# Patient Record
Sex: Female | Born: 1975 | Race: White | Hispanic: No | Marital: Married | Smoking: Never smoker
Health system: Southern US, Community
[De-identification: ages and names within clinical notes are randomized; demographics above are authoritative.]

## PROBLEM LIST (undated history)

## (undated) DIAGNOSIS — K2970 Gastritis, unspecified, without bleeding: Secondary | ICD-10-CM

## (undated) DIAGNOSIS — E785 Hyperlipidemia, unspecified: Secondary | ICD-10-CM

## (undated) DIAGNOSIS — R000 Tachycardia, unspecified: Secondary | ICD-10-CM

## (undated) HISTORY — PX: VARICOSE VEIN SURGERY: SHX832

## (undated) HISTORY — PX: WISDOM TOOTH EXTRACTION: SHX21

## (undated) HISTORY — DX: Hyperlipidemia, unspecified: E78.5

## (undated) HISTORY — PX: OVARIAN CYST REMOVAL: SHX89

---

## 1998-02-04 ENCOUNTER — Emergency Department (HOSPITAL_COMMUNITY): Admission: EM | Admit: 1998-02-04 | Discharge: 1998-02-04 | Payer: Self-pay

## 1998-03-27 ENCOUNTER — Other Ambulatory Visit: Admission: RE | Admit: 1998-03-27 | Discharge: 1998-03-27 | Payer: Self-pay | Admitting: Obstetrics & Gynecology

## 1999-12-13 ENCOUNTER — Other Ambulatory Visit: Admission: RE | Admit: 1999-12-13 | Discharge: 1999-12-13 | Payer: Self-pay | Admitting: Obstetrics & Gynecology

## 2001-05-16 ENCOUNTER — Other Ambulatory Visit: Admission: RE | Admit: 2001-05-16 | Discharge: 2001-05-16 | Payer: Self-pay | Admitting: Obstetrics & Gynecology

## 2002-07-22 ENCOUNTER — Other Ambulatory Visit: Admission: RE | Admit: 2002-07-22 | Discharge: 2002-07-22 | Payer: Self-pay | Admitting: Obstetrics & Gynecology

## 2002-10-21 ENCOUNTER — Other Ambulatory Visit: Admission: RE | Admit: 2002-10-21 | Discharge: 2002-10-21 | Payer: Self-pay | Admitting: Obstetrics & Gynecology

## 2003-09-22 ENCOUNTER — Other Ambulatory Visit: Admission: RE | Admit: 2003-09-22 | Discharge: 2003-09-22 | Payer: Self-pay | Admitting: Obstetrics and Gynecology

## 2005-09-12 ENCOUNTER — Other Ambulatory Visit: Admission: RE | Admit: 2005-09-12 | Discharge: 2005-09-12 | Payer: Self-pay | Admitting: Obstetrics & Gynecology

## 2006-03-16 ENCOUNTER — Inpatient Hospital Stay (HOSPITAL_COMMUNITY): Admission: AD | Admit: 2006-03-16 | Discharge: 2006-03-19 | Payer: Self-pay | Admitting: Obstetrics & Gynecology

## 2006-03-20 ENCOUNTER — Encounter: Admission: RE | Admit: 2006-03-20 | Discharge: 2006-04-19 | Payer: Self-pay | Admitting: Obstetrics & Gynecology

## 2006-04-20 ENCOUNTER — Encounter: Admission: RE | Admit: 2006-04-20 | Discharge: 2006-05-19 | Payer: Self-pay | Admitting: Obstetrics & Gynecology

## 2006-05-20 ENCOUNTER — Encounter: Admission: RE | Admit: 2006-05-20 | Discharge: 2006-06-19 | Payer: Self-pay | Admitting: Obstetrics & Gynecology

## 2006-06-20 ENCOUNTER — Encounter: Admission: RE | Admit: 2006-06-20 | Discharge: 2006-07-19 | Payer: Self-pay | Admitting: Obstetrics & Gynecology

## 2006-07-20 ENCOUNTER — Encounter: Admission: RE | Admit: 2006-07-20 | Discharge: 2006-08-19 | Payer: Self-pay | Admitting: Obstetrics & Gynecology

## 2006-08-20 ENCOUNTER — Encounter: Admission: RE | Admit: 2006-08-20 | Discharge: 2006-09-19 | Payer: Self-pay | Admitting: Obstetrics & Gynecology

## 2006-09-20 ENCOUNTER — Encounter: Admission: RE | Admit: 2006-09-20 | Discharge: 2006-10-03 | Payer: Self-pay | Admitting: Obstetrics & Gynecology

## 2006-11-06 ENCOUNTER — Ambulatory Visit: Payer: Self-pay | Admitting: Vascular Surgery

## 2007-02-12 ENCOUNTER — Ambulatory Visit: Payer: Self-pay | Admitting: Vascular Surgery

## 2007-03-02 ENCOUNTER — Emergency Department (HOSPITAL_COMMUNITY): Admission: EM | Admit: 2007-03-02 | Discharge: 2007-03-03 | Payer: Self-pay | Admitting: Emergency Medicine

## 2007-03-21 ENCOUNTER — Ambulatory Visit: Payer: Self-pay | Admitting: Vascular Surgery

## 2007-03-28 ENCOUNTER — Ambulatory Visit: Payer: Self-pay | Admitting: Vascular Surgery

## 2008-01-28 ENCOUNTER — Ambulatory Visit: Payer: Self-pay | Admitting: Family Medicine

## 2008-01-28 DIAGNOSIS — E785 Hyperlipidemia, unspecified: Secondary | ICD-10-CM

## 2008-01-28 DIAGNOSIS — E663 Overweight: Secondary | ICD-10-CM | POA: Insufficient documentation

## 2008-01-28 DIAGNOSIS — R030 Elevated blood-pressure reading, without diagnosis of hypertension: Secondary | ICD-10-CM

## 2008-01-28 LAB — CONVERTED CEMR LAB
ALT: 14 units/L (ref 0–35)
AST: 19 units/L (ref 0–37)
Albumin: 3.8 g/dL (ref 3.5–5.2)
BUN: 10 mg/dL (ref 6–23)
Basophils Relative: 0.5 % (ref 0.0–1.0)
CO2: 27 meq/L (ref 19–32)
Chloride: 105 meq/L (ref 96–112)
Creatinine, Ser: 0.7 mg/dL (ref 0.4–1.2)
Direct LDL: 167 mg/dL
Eosinophils Absolute: 0.1 10*3/uL (ref 0.0–0.7)
Eosinophils Relative: 1 % (ref 0.0–5.0)
HDL: 39.5 mg/dL (ref >39.0)
Lymphocytes Relative: 23.6 % (ref 12.0–46.0)
MCV: 85.6 fL (ref 78.0–100.0)
Neutrophils Relative %: 69.3 % (ref 43.0–77.0)
RBC: 4.66 M/uL (ref 3.87–5.11)
Total Protein: 7.2 g/dL (ref 6.0–8.3)
WBC: 8.3 10*3/uL (ref 4.5–10.5)

## 2008-02-01 ENCOUNTER — Telehealth: Payer: Self-pay | Admitting: Family Medicine

## 2008-02-07 ENCOUNTER — Encounter: Admission: RE | Admit: 2008-02-07 | Discharge: 2008-02-07 | Payer: Self-pay | Admitting: Family Medicine

## 2008-02-18 ENCOUNTER — Ambulatory Visit: Payer: Self-pay | Admitting: Family Medicine

## 2008-02-27 ENCOUNTER — Telehealth (INDEPENDENT_AMBULATORY_CARE_PROVIDER_SITE_OTHER): Payer: Self-pay | Admitting: *Deleted

## 2008-05-21 ENCOUNTER — Ambulatory Visit: Payer: Self-pay | Admitting: Family Medicine

## 2008-05-21 LAB — CONVERTED CEMR LAB: Direct LDL: 154.6 mg/dL

## 2008-05-28 ENCOUNTER — Ambulatory Visit: Payer: Self-pay | Admitting: Family Medicine

## 2010-02-05 ENCOUNTER — Emergency Department (HOSPITAL_COMMUNITY): Admission: EM | Admit: 2010-02-05 | Discharge: 2010-02-05 | Payer: Self-pay | Admitting: Emergency Medicine

## 2010-10-24 LAB — URINALYSIS, ROUTINE W REFLEX MICROSCOPIC
Bilirubin Urine: NEGATIVE
Nitrite: NEGATIVE
Specific Gravity, Urine: 1.028 (ref 1.005–1.030)
Urobilinogen, UA: 1 mg/dL (ref 0.0–1.0)

## 2010-10-24 LAB — CBC
MCH: 30.7 pg (ref 26.0–34.0)
Platelets: 231 10*3/uL (ref 150–400)
RBC: 4.79 MIL/uL (ref 3.87–5.11)
RDW: 12.7 % (ref 11.5–15.5)
WBC: 13.8 10*3/uL — ABNORMAL HIGH (ref 4.0–10.5)

## 2010-10-24 LAB — DIFFERENTIAL
Basophils Absolute: 0 10*3/uL (ref 0.0–0.1)
Eosinophils Relative: 0 % (ref 0–5)
Lymphocytes Relative: 2 % — ABNORMAL LOW (ref 12–46)
Lymphs Abs: 0.3 10*3/uL — ABNORMAL LOW (ref 0.7–4.0)
Monocytes Absolute: 0.3 10*3/uL (ref 0.1–1.0)
Monocytes Relative: 2 % — ABNORMAL LOW (ref 3–12)

## 2010-10-24 LAB — COMPREHENSIVE METABOLIC PANEL
AST: 24 U/L (ref 0–37)
Albumin: 3.7 g/dL (ref 3.5–5.2)
Chloride: 109 mEq/L (ref 96–112)
Creatinine, Ser: 0.77 mg/dL (ref 0.4–1.2)
GFR calc Af Amer: >60 mL/min (ref >60)
Total Bilirubin: 0.6 mg/dL (ref 0.3–1.2)
Total Protein: 6.7 g/dL (ref 6.0–8.3)

## 2010-10-24 LAB — POCT PREGNANCY, URINE: Preg Test, Ur: NEGATIVE

## 2010-11-12 ENCOUNTER — Encounter: Payer: Self-pay | Admitting: Family Medicine

## 2010-11-19 ENCOUNTER — Encounter: Payer: Self-pay | Admitting: Family Medicine

## 2010-11-25 ENCOUNTER — Ambulatory Visit: Payer: Self-pay | Admitting: Family Medicine

## 2010-12-09 ENCOUNTER — Encounter: Payer: Self-pay | Admitting: Family Medicine

## 2010-12-13 ENCOUNTER — Encounter: Payer: Self-pay | Admitting: Family Medicine

## 2010-12-13 ENCOUNTER — Ambulatory Visit (INDEPENDENT_AMBULATORY_CARE_PROVIDER_SITE_OTHER): Payer: Managed Care, Other (non HMO) | Admitting: Family Medicine

## 2010-12-13 VITALS — BP 110/68 | HR 84 | Resp 12 | Ht 66.0 in | Wt 203.8 lb

## 2010-12-13 DIAGNOSIS — Z13220 Encounter for screening for lipoid disorders: Secondary | ICD-10-CM

## 2010-12-13 DIAGNOSIS — Z136 Encounter for screening for cardiovascular disorders: Secondary | ICD-10-CM

## 2010-12-13 DIAGNOSIS — Z0000 Encounter for general adult medical examination without abnormal findings: Secondary | ICD-10-CM

## 2010-12-13 LAB — CBC WITH DIFFERENTIAL/PLATELET
Basophils Relative: 0.4 % (ref 0.0–3.0)
Eosinophils Absolute: 0.1 10*3/uL (ref 0.0–0.7)
Eosinophils Relative: 0.9 % (ref 0.0–5.0)
Hemoglobin: 14.6 g/dL (ref 12.0–15.0)
Lymphocytes Relative: 27.2 % (ref 12.0–46.0)
MCHC: 34.9 g/dL (ref 30.0–36.0)
MCV: 87.8 fl (ref 78.0–100.0)
Neutro Abs: 4.2 10*3/uL (ref 1.4–7.7)
Neutrophils Relative %: 65.8 % (ref 43.0–77.0)
RBC: 4.78 Mil/uL (ref 3.87–5.11)
WBC: 6.4 10*3/uL (ref 4.5–10.5)

## 2010-12-13 LAB — POCT URINALYSIS DIPSTICK
Bilirubin, UA: NEGATIVE
Blood, UA: NEGATIVE
Protein, UA: NEGATIVE
Spec Grav, UA: 1.015
pH, UA: 5

## 2010-12-13 LAB — HEPATIC FUNCTION PANEL
AST: 24 U/L (ref 0–37)
Albumin: 4.4 g/dL (ref 3.5–5.2)
Alkaline Phosphatase: 55 U/L (ref 39–117)
Bilirubin, Direct: 0 mg/dL (ref 0.0–0.3)
Total Protein: 7.4 g/dL (ref 6.0–8.3)

## 2010-12-13 LAB — BASIC METABOLIC PANEL
BUN: 12 mg/dL (ref 6–23)
CO2: 28 mEq/L (ref 19–32)
Chloride: 103 mEq/L (ref 96–112)
Creatinine, Ser: 0.8 mg/dL (ref 0.4–1.2)
Glucose, Bld: 78 mg/dL (ref 70–99)

## 2010-12-13 NOTE — Progress Notes (Signed)
  Subjective:     Bonnie Bates is a 36 y.o. female and is here for a comprehensive physical exam. The patient reports no problems.  History   Social History  . Marital Status: Married    Spouse Name: N/A    Number of Children: 2  . Years of Education: 14   Occupational History  . [employer]    Social History Main Topics  . Smoking status: Never Smoker   . Smokeless tobacco: Never Used  . Alcohol Use: 0.5 oz/week    1 drink(s) per week     rare--< 1 a week  . Drug Use: No  . Sexually Active: Yes -- Female partner(s)   Other Topics Concern  . Not on file   Social History Narrative  . No narrative on file   Health Maintenance  Topic Date Due  . Pap Smear  03/25/2013  . Tetanus/tdap  05/28/2018    The following portions of the patient's history were reviewed and updated as appropriate: allergies, current medications, past family history, past medical history, past social history, past surgical history and problem list.  Review of Systems  Review of Systems  Constitutional: Negative for activity change, appetite change and fatigue.  HENT: Negative for hearing loss, congestion, tinnitus and ear discharge.  dentist q18m Eyes: Negative for visual disturbance  optho--due Respiratory: Negative for cough, chest tightness and shortness of breath.   Cardiovascular: Negative for chest pain, palpitations and leg swelling.  Gastrointestinal: Negative for abdominal pain, diarrhea, constipation and abdominal distention. +ext hemorrhoids Genitourinary: Negative for urgency, frequency, decreased urine volume and difficulty urinating.  Musculoskeletal: Negative for back pain, arthralgias and gait problem.  Skin: Negative for color change, pallor and rash.  Neurological: Negative for dizziness, light-headedness, numbness and headaches.  Hematological: Negative for adenopathy. Does not bruise/bleed easily.  Psychiatric/Behavioral: Negative for suicidal  ideas, confusion, sleep disturbance, self-injury, dysphoric mood, decreased concentration and agitation.   Objective:    BP 110/68  Pulse 84  Resp 12  Ht 5\' 6"  (1.676 m)  Wt 203 lb 12.8 oz (92.443 kg)  BMI 32.89 kg/m2 General appearance: alert, cooperative, appears stated age and no distress Head: Normocephalic, without obvious abnormality, atraumatic Eyes: conjunctivae/corneas clear. PERRL, EOM's intact. Fundi benign. Ears: normal TM's and external ear canals both ears Nose: Nares normal. Septum midline. Mucosa normal. No drainage or sinus tenderness. Throat: lips, mucosa, and tongue normal; teeth and gums normal Neck: no adenopathy, no carotid bruit, no JVD, supple, symmetrical, trachea midline and thyroid not enlarged, symmetric, no tenderness/mass/nodules Lungs: clear to auscultation bilaterally Breasts: normal appearance, no masses or tenderness Heart: regular rate and rhythm, S1, S2 normal, no murmur, click, rub or gallop Abdomen: soft, non-tender; bowel sounds normal; no masses,  no organomegaly Pelvic: gyn Extremities: extremities normal, atraumatic, no cyanosis or edema Pulses: 2+ and symmetric Skin: Skin color, texture, turgor normal. No rashes or lesions Lymph nodes: Cervical, supraclavicular, and axillary nodes normal. Neurologic: Grossly normal psych-- AAOx3,  no depression or anxiety   rectal-- + ext hem,  Heme neg brown stool Assessment:    Healthy female exam.  hemorrhoids Plan:    con't fiber and prep H Drink plenty of water Refer to GI if no relief See After Visit Summary for Counseling Recommendations

## 2010-12-13 NOTE — Patient Instructions (Signed)
Hemorrhoids Hemorrhoids are dilated (enlarged) veins around the rectum. Sometimes clots will form in the veins. This makes them swollen and painful. These are called thrombosed hemorrhoids. Causes of hemorrhoids include:  Pregnancy: this increases the pressure in the hemorrhoidal veins.   Constipation.   Straining to have a bowel movement.  HOME CARE INSTRUCTIONS  Eat a well balanced diet and drink 6 to 8 glasses of water every day to avoid constipation. You may also use a bulk laxative.   Avoid straining to have bowel movements.   Keep anal area dry and clean.   Only take over-the-counter or prescription medicines for pain, discomfort, or fever as directed by your caregiver.  If thrombosed:  Take hot sitz baths for 20 to 30 minutes, 3 to 4 times per day.   If the hemorrhoids are very tender and swollen, place ice packs on area as tolerated. Using ice packs between sitz baths may be helpful. Fill a plastic bag with ice and use a towel between the bag of ice and your skin.   Special creams and suppositories (Anusol, Nupercainal, Wyanoids) may be used or applied as directed.   Do not use a donut shaped pillow or sit on the toilet for long periods. This increases blood pooling and pain.   Move your bowels when your body has the urge; this will require less straining and will decrease pain and pressure.   Only take over-the-counter or prescription medicines for pain, discomfort, or fever as directed by your caregiver.  SEEK MEDICAL CARE IF:  You have increasing pain and swelling that is not controlled with your prescription.   You have uncontrolled bleeding.   You have an inability or difficulty having a bowel movement.   You have pain or inflammation outside the area of the hemorrhoids.   You have chills and/or an oral temperature above 100.4 that lasts for 2 days or longer, or as your caregiver suggests.  MAKE SURE YOU:   Understand these instructions.   Will watch your  condition.   Will get help right away if you are not doing well or get worse.  Document Released: 07/22/2000 Document Re-Released: 07/07/2008 [facility] Patient Information 2011 [place], [place].

## 2010-12-16 ENCOUNTER — Telehealth: Payer: Self-pay | Admitting: *Deleted

## 2010-12-16 NOTE — Telephone Encounter (Signed)
See labs 

## 2010-12-16 NOTE — Telephone Encounter (Signed)
Pt left VM that she would like to know what her lab results are. Please advise

## 2010-12-17 NOTE — Telephone Encounter (Signed)
Pt aware of lab results, copy of labs mailed to Pt.

## 2010-12-21 NOTE — Procedures (Signed)
LOWER EXTREMITY VENOUS REFLUX EXAM   INDICATION:  Right leg varicose vein and pain.   EXAM:  Using color flow imaging  and post Doppler analysis, the right  common femoral vein, superficial femoral vein, popliteal posterior,  greater and lesser saphenous veins are evaluated. There is no evidence  suggesting deep venous insufficiency in the right lower extremity.  The  right saphenofemoral junction is not competent.  The right greater  saphenous vein is not competent with a caliber as described below.  The  right proximal short vein demonstrates competency.   GSV Diameter (used if found to be incompetent only)                                            Right    Left  Proximal Greater Saphenous Vein           0.48 cm  cm  Proximal-to-mid-thigh                     0.48 cm  cm  Mid thigh                                 0.40 cm  cm  Mid-distal thigh                          0.34 cm  cm  Distal thigh                              0.34 cm  cm  Knee                                      0.30 cm  cm   IMPRESSION:  1. 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  2. The right greater saphenous vein is not aneurysmal.  3. The right greater saphenous vein is not tortuous.  4. The deep system is competent.  5. The right lesser saphenous vein is competent.  6. No evidence of deep vein thrombosis noted in the right leg.   ___________________________________________  Larina Earthly, M.D.   MG/MEDQ  D:  02/12/2007  T:  02/12/2007  Job:  [id number]

## 2010-12-21 NOTE — Assessment & Plan Note (Signed)
OFFICE VISIT   [name], [name]  DOB:  April 11, 1976                                       03/28/2007  [id number]#:[id number]   Bonnie Bates presents today for follow up of her right great saphenous  vein laser ablation and staph phlebectomy of tributary varicosities. She  has done quite well. She has the usual amount of inflammation and  discomfort at the level of her saphenous vein ablation. She has minimal  bruising. She underwent a limited venous duplex today and this revealed  complete closure of her saphenous vein from the level of her knee to  just below her saphenofemoral junction. Her common femoral vein is  widely patent with no evidence of thrombus. I am quite pleased with her  initial result. She will continue to wear her compression garment for an  additional one week and we will see her again in six weeks in time for  follow up.   Larina Earthly, M.D.  Electronically Signed   TFE/MEDQ  D:  03/28/2007  T:  03/30/2007  Job:  [id number]   cc:   Ilda Mori, M.D.

## 2010-12-21 NOTE — Assessment & Plan Note (Signed)
OFFICE VISIT   JENNINGS, STIRLING  DOB:  Jun 25, 1976                                       02/12/2007  [id number]#:[id number]   The patient presents today for continued followup of her right venous  hypertension and saphenous varicosities. She has worn graduated  compression garments, thigh high, for 3 months. She has continued to  have increasing right leg pain with squatting position required during  housework and childcare. She experiences leg pain when going up and down  stairs and with prolonged standing. She also complains of pain over the  varicosities themselves and also a generalized aching in her right leg.   PHYSICAL EXAMINATION:  She has no evidence of varicosities in her left  leg. 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 These are mainly in the medial distal thigh and medial right calf.  She underwent formal duplex evaluation today and this reveals her  saphenous vein to be non-aneurysmal. She does have reflux throughout its  course from the saphenofemoral junction distally.   I discussed options with the patient. I have recommended laser ablation  of her right great saphenous vein and stab phlebectomy of her tributary  varicosities. I would anticipate excellent relief of her symptoms from  this due to her severe venous hypertension. I explained the procedure as  outpatient in our office taking approximately 1 to 1-1/2 hours with  ablation and stab phlebectomy under local anesthesia. I explained the  risks including very slight, less than 1% risk,  of deep venous thrombosis. She understands and we will proceed with this  as soon as she has assured her insurance coverage.   Larina Earthly, M.D.  Electronically Signed   TFE/MEDQ  D:  02/12/2007  T:  02/12/2007  Job:  [id number]   cc:   Ilda Mori, M.D.

## 2011-01-04 ENCOUNTER — Encounter: Payer: Self-pay | Admitting: Gastroenterology

## 2011-01-04 ENCOUNTER — Ambulatory Visit (INDEPENDENT_AMBULATORY_CARE_PROVIDER_SITE_OTHER): Payer: Managed Care, Other (non HMO) | Admitting: Gastroenterology

## 2011-01-04 VITALS — BP 110/70 | HR 96 | Ht 66.0 in | Wt 201.0 lb

## 2011-01-04 DIAGNOSIS — K625 Hemorrhage of anus and rectum: Secondary | ICD-10-CM

## 2011-01-04 MED ORDER — PEG-KCL-NACL-NASULF-NA ASC-C 100 G PO SOLR: 1.0000 | Freq: Once | ORAL | Status: AC

## 2011-01-04 NOTE — Progress Notes (Signed)
History of Present Illness:  Bonnie Bates is a pleasant 35 year old white female referred at the request of Dr. Tawanna Cooler for evaluation of rectal bleeding. About 6 weeks ago she had a five-day period of persistent bright red blood per rectum consisting of small amounts of blood on the toilet tissue. This would occur with or without a bowel movement. There has been no change in bowel habits. She denies rectal or abdominal pain. She's had no recurrence of bleeding.    Review of Systems: Pertinent positive and negative review of systems were noted in the above HPI section. All other review of systems were otherwise negative.    Current Medications, Allergies, Past Medical History, Past Surgical History, Family History and Social History were reviewed in [facility] electronic medical record  Vital signs were reviewed in today's medical record. Physical Exam: General: Well developed , well nourished, no acute distress Head: Normocephalic and atraumatic Eyes:  sclerae anicteric, EOMI Ears: Normal auditory acuity Mouth: No deformity or lesions Lungs: Clear throughout to auscultation Heart: Regular rate and rhythm; no murmurs, rubs or bruits Abdomen: Soft, non tender and non distended. No masses, hepatosplenomegaly or hernias noted. Normal Bowel sounds Rectal: There are no external lesions. Musculoskeletal: Symmetrical with no gross deformities  Pulses:  Normal pulses noted Extremities: No clubbing, cyanosis, edema or deformities noted Neurological: Alert oriented x 4, grossly nonfocal Psychological:  Alert and cooperative. Normal mood and affect Skin - there is a dry somewhat raised rash on the posterior aspect of her left buttock and proximal thigh

## 2011-01-04 NOTE — Assessment & Plan Note (Signed)
Limited bleeding is most likely related to hemorrhoids. A more proximal colonic bleeding source is less likely although should be ruled out.  Recommendations #1 colonoscopy  Risks, alternatives, and complications of the procedure, including bleeding, perforation, and possible need for surgery, were explained to the patient.  Patient's questions were answered.

## 2011-01-04 NOTE — Patient Instructions (Signed)
Colonoscopy A colonoscopy is an exam to evaluate your entire colon. In this exam, your colon is cleansed. A long fiberoptic tube is inserted through your rectum and into your colon. The fiberoptic scope (endoscope) is a long bundle of enclosed and very flexible fibers. These fibers transmit light to the area examined and send images from that area to your caregiver. Discomfort is usually minimal. You may be given a drug to help you sleep (sedative) during or prior to the procedure. This exam helps to detect lumps (tumors), polyps, inflammation, and areas of bleeding. Your caregiver may also take a small piece of tissue (biopsy) that will be examined under a microscope. BEFORE THE PROCEDURE  A clear liquid diet may be required for 2 days before the exam.   Liquid injections (enemas) or laxatives may be required.   A large amount of electrolyte solution may be given to you to drink over a short period of time. This solution is used to clean out your colon.   You should be present 1 prior to your procedure or as directed by your caregiver.   Check in at the admissions desk to fill out necessary forms if not preregistered. There will be consent forms to sign prior to the procedure. If accompanied by friends or family, there is a waiting area for them while you are having your procedure.  LET YOUR CAREGIVER KNOW ABOUT:  Allergies to food or medicine.  Medicines taken, including vitamins, herbs, eyedrops, over-the-counter medicines, and creams.   Use of steroids (by mouth or creams).   Previous problems with anesthetics or numbing medicines.   History of bleeding problems or blood clots.  Previous surgery.   Other health problems, including diabetes and kidney problems.   Possibility of pregnancy, if this applies.   AFTER THE PROCEDURE  If you received a sedative and/or pain medicine, you will need to arrange for someone to drive you home.   Occasionally, there is a little blood passed  with the first bowel movement. DO NOT be concerned.  HOME CARE INSTRUCTIONS  It is not unusual to pass moderate amounts of gas and experience mild abdominal cramping following the procedure. This is due to air being used to inflate your colon during the exam. Walking or a warm pack on your belly (abdomen) may help.   You may resume all normal meals and activities after sedatives and medicines have worn off.   Only take over-the-counter or prescription medicines for pain, discomfort, or fever as directed by your caregiver. DO NOT use aspirin or blood thinners if a biopsy was taken. Consult your caregiver for medicine usage if biopsies were taken.  FINDING OUT THE RESULTS OF YOUR TEST Not all test results are available during your visit. If your test results are not back during the visit, make an appointment with your caregiver to find out the results. Do not assume everything is normal if you have not heard from your caregiver or the medical facility. It is important for you to follow up on all of your test results. SEEK IMMEDIATE MEDICAL CARE IF:  You pass large blood clots or fill a toilet with blood following the procedure. This may also occur 10 to 14 days following the procedure. This is more likely if a biopsy was taken.   You develop abdominal pain that keeps getting worse and cannot be relieved with medicine.  Document Released: 07/22/2000 Document Re-Released: 10/19/2009 [facility] Patient Information 2011 [place], [place]. Your Colonoscopy is scheduled on 02/01/2011 at  2pm Your MoviPrep will be sent to your pharmacy today

## 2011-01-31 ENCOUNTER — Telehealth: Payer: Self-pay | Admitting: Gastroenterology

## 2011-01-31 NOTE — Telephone Encounter (Signed)
Pt should not be rescheduled until penalty is paid

## 2011-02-01 ENCOUNTER — Other Ambulatory Visit: Payer: Managed Care, Other (non HMO) | Admitting: Gastroenterology

## 2011-02-18 ENCOUNTER — Encounter: Payer: Self-pay | Admitting: Gastroenterology

## 2011-03-30 NOTE — Telephone Encounter (Signed)
error 

## 2012-12-07 ENCOUNTER — Telehealth: Payer: Self-pay | Admitting: Family Medicine

## 2012-12-07 ENCOUNTER — Ambulatory Visit (INDEPENDENT_AMBULATORY_CARE_PROVIDER_SITE_OTHER): Payer: BC Managed Care – PPO | Admitting: Family

## 2012-12-07 ENCOUNTER — Encounter: Payer: Self-pay | Admitting: Family

## 2012-12-07 VITALS — BP 120/80 | HR 100 | Wt 210.0 lb

## 2012-12-07 DIAGNOSIS — R0789 Other chest pain: Secondary | ICD-10-CM

## 2012-12-07 LAB — CBC WITH DIFFERENTIAL/PLATELET
Basophils Relative: 0.4 % (ref 0.0–3.0)
Eosinophils Relative: 0.8 % (ref 0.0–5.0)
Lymphocytes Relative: 23.7 % (ref 12.0–46.0)
Monocytes Absolute: 0.5 10*3/uL (ref 0.1–1.0)
Monocytes Relative: 5.7 % (ref 3.0–12.0)
Neutrophils Relative %: 69.4 % (ref 43.0–77.0)
Platelets: 285 10*3/uL (ref 150.0–400.0)
RBC: 4.92 Mil/uL (ref 3.87–5.11)
WBC: 9.2 10*3/uL (ref 4.5–10.5)

## 2012-12-07 LAB — TSH: TSH: 0.51 u[IU]/mL (ref 0.35–5.50)

## 2012-12-07 NOTE — Patient Instructions (Signed)
Chest Pain (Nonspecific) It is often hard to give a specific diagnosis for the cause of chest pain. There is always a chance that your pain could be related to something serious, such as a heart attack or a blood clot in the lungs. You need to follow up with your caregiver for further evaluation. CAUSES   Heartburn.  Pneumonia or bronchitis.  Anxiety or stress.  Inflammation around your heart (pericarditis) or lung (pleuritis or pleurisy).  A blood clot in the lung.  A collapsed lung (pneumothorax). It can develop suddenly on its own (spontaneous pneumothorax) or from injury (trauma) to the chest.  Shingles infection (herpes zoster virus). The chest wall is composed of bones, muscles, and cartilage. Any of these can be the source of the pain.  The bones can be bruised by injury.  The muscles or cartilage can be strained by coughing or overwork.  The cartilage can be affected by inflammation and become sore (costochondritis). DIAGNOSIS  Lab tests or other studies, such as X-rays, electrocardiography, stress testing, or cardiac imaging, may be needed to find the cause of your pain.  TREATMENT   Treatment depends on what may be causing your chest pain. Treatment may include:  Acid blockers for heartburn.  Anti-inflammatory medicine.  Pain medicine for inflammatory conditions.  Antibiotics if an infection is present.  You may be advised to change lifestyle habits. This includes stopping smoking and avoiding alcohol, caffeine, and chocolate.  You may be advised to keep your head raised (elevated) when sleeping. This reduces the chance of acid going backward from your stomach into your esophagus.  Most of the time, nonspecific chest pain will improve within 2 to 3 days with rest and mild pain medicine. HOME CARE INSTRUCTIONS   If antibiotics were prescribed, take your antibiotics as directed. Finish them even if you start to feel better.  For the next few days, avoid physical  activities that bring on chest pain. Continue physical activities as directed.  Do not smoke.  Avoid drinking alcohol.  Only take over-the-counter or prescription medicine for pain, discomfort, or fever as directed by your caregiver.  Follow your caregiver's suggestions for further testing if your chest pain does not go away.  Keep any follow-up appointments you made. If you do not go to an appointment, you could develop lasting (chronic) problems with pain. If there is any problem keeping an appointment, you must call to reschedule. SEEK MEDICAL CARE IF:   You think you are having problems from the medicine you are taking. Read your medicine instructions carefully.  Your chest pain does not go away, even after treatment.  You develop a rash with blisters on your chest. SEEK IMMEDIATE MEDICAL CARE IF:   You have increased chest pain or pain that spreads to your arm, neck, jaw, back, or abdomen.  You develop shortness of breath, an increasing cough, or you are coughing up blood.  You have severe back or abdominal pain, feel nauseous, or vomit.  You develop severe weakness, fainting, or chills.  You have a fever. THIS IS AN EMERGENCY. Do not wait to see if the pain will go away. Get medical help at once. Call your local emergency services (911 in [place] Do not drive yourself to the hospital. MAKE SURE YOU:   Understand these instructions.  Will watch your condition.  Will get help right away if you are not doing well or get worse. Document Released: 05/04/2005 Document Revised: 10/17/2011 Document Reviewed: 02/28/2008 [employer] Patient Information 2013 [employer].  

## 2012-12-07 NOTE — Telephone Encounter (Signed)
Patient Information:  Caller Name: Salena  Phone: [phone]  Patient: Bonnie Bates, Bonnie Bates  Gender: Female  DOB: 09/05/1975  Age: 37 Years  PCP: Kelle Darting [facility])  Pregnant: No  Office Follow Up:  Does the office need to follow up with this patient?: Yes  Instructions For The Office: PT REFUSES TO CALL 911.  WANTED AN APPT WITH DR. TODD TODAY INSTEAD.  ADVISED PT THAT DR. TODD IS OUT OF OFFICE TODAY.  PLEASE CALL PT BACK TO ADVISE HER TO CALL 911 OR IF ANOTHER MD IN OFFICE AGREES THAT SHE CAN COME TO OFFICE FOR APPT.  RN Note:  PT REFUSES 911 OR ED.  WANTS TO COME TO OFFICE TO SEE DR. TOOD.  ADVISED PT DR. TODD IS OUT OF OFFICE TODAY.  Symptoms  Reason For Call & Symptoms: Pt calling today 12/07/12 regarding having dull achey chest pain that comes and goes.  This has been going on for about 1 year.  However today when she woke up she is having some tightness that goes all the way across her chest.  This has resolved.  This lasted for about 2 hours.  The pain has been gone for about 1 hour.  Reviewed Health History In EMR: N/A  Reviewed Medications In EMR: N/A  Reviewed Allergies In EMR: N/A  Reviewed Surgeries / Procedures: N/A  Date of Onset of Symptoms: 12/07/2012 OB / GYN:  LMP: 10/07/2012  Guideline(s) Used:  Chest Pain  Disposition Per Guideline:   Call EMS 911 Now  Reason For Disposition Reached:   Chest pain lasting longer than 5 minutes and ANY of the following:  Over 38 years old Over 83 years old and at least one cardiac risk factor (i.e., high blood pressure, diabetes, high cholesterol, obesity, smoker or strong family history of heart disease) Pain is crushing, pressure-like, or heavy  Took nitroglycerin and chest pain was not relieved History of heart disease (i.e., angina, heart attack, bypass surgery, angioplasty, CHF)  Advice Given:  N/A  Patient Refused Recommendation:  Patient Refused Care Advice  PT REFUSES 911, WANTS APPT AT OFFICE INSTEAD

## 2012-12-07 NOTE — Progress Notes (Signed)
Subjective:    Patient ID: Bonnie Bates, female    DOB: August 18, 1975, 37 y.o.   MRN: [id number]  HPI 37 year old white female, nonsmoker, patient of Dr. Tawanna Cooler is in today with complaints of chest wall pain that originally began under her left armpit approximately one year ago and comes and goes. The pain typically last 10-15 minutes. No worse with movement. This morning, she noticed that the pain was not only on the left side but when across her chest to the right side as well. She rates the pain in 3-4/10. It tends to be more noticeable at night. The pain typically goes away on its own. Denies any heavy lifting, bending, twisting, or turning. She has a history of hypercholesterolemia-untreated. Father deceased at age 18 due to myocardial infarction. His first event was at age 58.   Review of Systems  Constitutional: Negative.   HENT: Negative.   Respiratory: Negative.   Cardiovascular: Positive for chest pain. Negative for leg swelling.  Gastrointestinal: Negative.   Endocrine: Negative.   Genitourinary: Negative.   Musculoskeletal: Negative.   Skin: Negative.   Allergic/Immunologic: Negative.   Neurological: Negative.   Hematological: Negative.   Psychiatric/Behavioral: Negative.    Past Medical History  Diagnosis Date  . Newborn product of IVF pregnancy   . Hyperlipidemia     History   Social History  . Marital Status: Married    Spouse Name: N/A    Number of Children: 2  . Years of Education: 14   Occupational History  . [employer]    Social History Main Topics  . Smoking status: Never Smoker   . Smokeless tobacco: Never Used  . Alcohol Use: 0.5 oz/week    1 drink(s) per week     Comment: rare--< 1 a week  . Drug Use: No  . Sexually Active: Yes -- Female partner(s)   Other Topics Concern  . Not on file   Social History Narrative  . No narrative on file    Past Surgical History  Procedure Laterality Date  . Wisdom tooth  extraction    . Ovarian cyst removal    . Varicose vein surgery      Right Leg via Laser    Family History  Problem Relation Age of Onset  . Heart attack Father     smoker  . Sudden death Father   . Heart disease Father 21    MI---second one at 31 and died  . Heart disease Maternal Grandfather   . Stroke Maternal Grandfather   . Hypertension Maternal Grandfather   . Kidney disease Maternal Grandfather   . Diabetes Maternal Grandmother   . Colon cancer Neg Hx     No Known Allergies  No current outpatient prescriptions on file prior to visit.   No current facility-administered medications on file prior to visit.    BP 120/80  Pulse 100  Wt 210 lb (95.255 kg)  BMI 33.91 kg/m2  SpO2 98%chart    Objective:   Physical Exam  Constitutional: She is oriented to person, place, and time. She appears well-developed and well-nourished.  Neck: Normal range of motion. Neck supple.  Cardiovascular: Normal rate, regular rhythm and normal heart sounds.   Pulmonary/Chest: Effort normal and breath sounds normal.  Abdominal: Soft. Bowel sounds are normal.  Musculoskeletal: Normal range of motion.  Neurological: She is alert and oriented to person, place, and time.  Skin: Skin is warm and dry.  Psychiatric: She has a normal  mood and affect.      EKG: Sinus tachycardia, otherwise within normal limits    Assessment & Plan:  Assessment: 1. Chest discomfort 2. Hypercholesterolemia  Plan: Lab sent to include CMP, CBC, TSH notify patient pending results. Given her family history, we have elected to proceed with sent her to cardiology for a stress test. She will return for complete physical exam. Her cholesterol in the past has been elevated without treatment. However, it's been approximately 2 years since that test was performed. So we will reestablish a baseline and treat accordingly. In the meantime, if chest discomfort returns over the weekend patient advised to go to the emergency  department.

## 2012-12-07 NOTE — Telephone Encounter (Signed)
Appointment made

## 2012-12-10 ENCOUNTER — Ambulatory Visit: Payer: Managed Care, Other (non HMO) | Admitting: Family Medicine

## 2012-12-10 LAB — COMPREHENSIVE METABOLIC PANEL
Albumin: 4.6 g/dL (ref 3.5–5.2)
Alkaline Phosphatase: 56 U/L (ref 39–117)
BUN: 11 mg/dL (ref 6–23)
CO2: 26 mEq/L (ref 19–32)
Calcium: 9.5 mg/dL (ref 8.4–10.5)
GFR: 75.44 mL/min (ref >60.00)
Glucose, Bld: 106 mg/dL — ABNORMAL HIGH (ref 70–99)
Potassium: 4.3 mEq/L (ref 3.5–5.1)
Sodium: 138 mEq/L (ref 135–145)
Total Protein: 7.8 g/dL (ref 6.0–8.3)

## 2012-12-20 ENCOUNTER — Telehealth: Payer: Self-pay | Admitting: Family Medicine

## 2012-12-20 NOTE — Telephone Encounter (Signed)
Patient called stating that she was told she would get a call back to discuss labs and has not been called. Please assist.

## 2012-12-23 NOTE — Telephone Encounter (Signed)
A letter was mailed to her--- did this really have to come to me

## 2012-12-24 ENCOUNTER — Telehealth: Payer: Self-pay | Admitting: Family Medicine

## 2012-12-24 ENCOUNTER — Ambulatory Visit (INDEPENDENT_AMBULATORY_CARE_PROVIDER_SITE_OTHER): Payer: BC Managed Care – PPO | Admitting: Family Medicine

## 2012-12-24 ENCOUNTER — Encounter: Payer: Self-pay | Admitting: Family Medicine

## 2012-12-24 VITALS — BP 108/78 | HR 93 | Temp 98.5°F | Wt 212.0 lb

## 2012-12-24 DIAGNOSIS — R000 Tachycardia, unspecified: Secondary | ICD-10-CM

## 2012-12-24 DIAGNOSIS — E785 Hyperlipidemia, unspecified: Secondary | ICD-10-CM

## 2012-12-24 LAB — LIPID PANEL
Cholesterol: 217 mg/dL — ABNORMAL HIGH (ref 0–200)
HDL: 40 mg/dL (ref >39.00)
Triglycerides: 148 mg/dL (ref 0.0–149.0)

## 2012-12-24 LAB — T3, FREE: T3, Free: 3.4 pg/mL (ref 2.3–4.2)

## 2012-12-24 LAB — T4, FREE: Free T4: 0.77 ng/dL (ref 0.60–1.60)

## 2012-12-24 MED ORDER — NADOLOL 20 MG PO TABS: ORAL_TABLET | ORAL | Status: DC

## 2012-12-24 NOTE — Telephone Encounter (Signed)
Patient Information:  Caller Name: Neville  Phone: [phone]  Patient: Bonnie Bates, Nephew  Gender: Female  DOB: 1976/07/31  Age: 37 Years  PCP: Kelle Darting [facility])  Pregnant: No  Office Follow Up:  Does the office need to follow up with this patient?: Yes  Instructions For The Office: PATIENT DECLINES ER/911.  CALM IN VOICE . NOT CURRENTLY HAVING PAIN.  +RAPID HEARTRATE . please contact. she has been seen in office on 12/07/12 and scheduled for stress test.  Unable to sleep or rest  RN Note:  Please contact patient. She is having rapid heartrate since 12/07/12.  She is unable to rest at night with episodes of feeling short of breath. Declines ER/911 . CONTACT - [phone]  Symptoms  Reason For Call & Symptoms: Patient states she was seen on May 2nd by Dr. Lubertha Sayres for Chest pain.  She was given blood work, EKG and told she tachycardic. Stress test scheduled-  May 27th. In the meantime, She is still having discomfort and rapid heartrate. Described as constant.  She is having difficult sleeping and some shortness of breath with palpatations.  No chest pain .  Voice Clear and calm.  Denies need of ER /or EMS.  Unsure what to do about palpatations.  Reviewed Health History In EMR: Yes  Reviewed Medications In EMR: Yes  Reviewed Allergies In EMR: Yes  Reviewed Surgeries / Procedures: Yes  Date of Onset of Symptoms: 12/07/2012  Treatments Tried: Rest , relax, decrease stress  Treatments Tried Worked: No OB / GYN:  LMP: Unknown  Guideline(s) Used:  Chest Pain  Disposition Per Guideline:   Go to ED Now  Reason For Disposition Reached:   Heart beating irregularly or very rapidly  Advice Given:  Call Back If:  Severe chest pain  Constant chest pain lasting longer than 5 minutes  Difficulty breathing  Fever  You become worse.  Patient Refused Recommendation:  Patient Refused Care Advice  PATIENT DECLINES ER/911.  CALM IN VOICE . NOT CURRENTLY HAVING PAIN.  +RAPID HEARTRATE  . please contact

## 2012-12-24 NOTE — Progress Notes (Signed)
  Subjective:    Patient ID: Bonnie Bates, female    DOB: 23-Jan-1976, 37 y.o.   MRN: [id number]  HPI Bonnie Bates is a 37 year old female nonsmoker,, recently separated from her husband,,,,,,,, 35-year-old and 57 year old at home,,,,, who comes in today for evaluation of rapid heart rate.  She was seen a while back ear with as history. Labs were normal however her TSH was slightly abnormal. It was 0.51. She has no history of thyroid disease  Her caffeine consumption is modest only one cup of coffee per day no tea no sodas.  Social history she is by herself now separated from her husband they're not going to seek counseling she tells me that he has moved on. Children are 6 and 16. 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 He was a smoker. Mother has a history of hypertension on medication stable 2 brothers in good health no sisters.  For birth control she gets a depth of shot every 3 months. Last Pap was 2 years ago. She was told she only needed one every 3 years however she did have colposcopy in the past for an abnormal Pap smear.   Review of Systems Review of systems otherwise negative    Objective:   Physical Exam  Well-developed well-nourished female no acute distress BP 108/78 pulse 90 and regular EKG within normal limits CBC been a normal TSH slightly low at 0. 51  Cardiopulmonary exam normal breast exam normal      Assessment & Plan:  Rapid heart rate most likely related underlying social stress,,,,,,,, DC caffeine begin beta blocker followup for general physical examination and 4 weeks recheck thyroid studies

## 2012-12-24 NOTE — Telephone Encounter (Signed)
Discuss with patient, Pt states that she did not received copy of labs.  Notes Recorded by Baker Pierini, FNP on 12/10/2012 at 4:30 PM Lab stable

## 2012-12-24 NOTE — Patient Instructions (Signed)
Caffeine free diet  Corgard 20 mg,,,, one tablet twice daily  Walk 30 minutes daily  Followup in one month,,,,,,,,, schedule a 30 minute appointment

## 2012-12-24 NOTE — Telephone Encounter (Signed)
Patient has an appointment with Dr Tawanna Cooler today.  I explained to patient that she must stay with one doctor.

## 2012-12-25 LAB — LDL CHOLESTEROL, DIRECT: Direct LDL: 160 mg/dL

## 2013-01-01 ENCOUNTER — Institutional Professional Consult (permissible substitution): Payer: BC Managed Care – PPO | Admitting: Cardiology

## 2013-01-18 ENCOUNTER — Encounter: Payer: BC Managed Care – PPO | Admitting: Family Medicine

## 2013-01-21 ENCOUNTER — Encounter: Payer: BC Managed Care – PPO | Admitting: Family Medicine

## 2013-02-11 ENCOUNTER — Other Ambulatory Visit: Payer: BC Managed Care – PPO

## 2013-02-18 ENCOUNTER — Encounter: Payer: BC Managed Care – PPO | Admitting: Family Medicine

## 2013-02-20 ENCOUNTER — Telehealth: Payer: Self-pay | Admitting: Family Medicine

## 2013-02-20 NOTE — Telephone Encounter (Signed)
Pt would like you to call

## 2013-02-21 NOTE — Telephone Encounter (Signed)
Left message on machine returning patient's call 

## 2013-02-21 NOTE — Telephone Encounter (Signed)
Patient called stating that the Atenolol is no longer working.  Left message on machine for patient to schedule an appointment.

## 2013-02-26 ENCOUNTER — Ambulatory Visit (INDEPENDENT_AMBULATORY_CARE_PROVIDER_SITE_OTHER): Payer: BC Managed Care – PPO | Admitting: Family

## 2013-02-26 ENCOUNTER — Telehealth: Payer: Self-pay | Admitting: Family Medicine

## 2013-02-26 ENCOUNTER — Encounter: Payer: Self-pay | Admitting: Family

## 2013-02-26 VITALS — BP 100/70 | HR 70 | Wt 215.0 lb

## 2013-02-26 DIAGNOSIS — R0789 Other chest pain: Secondary | ICD-10-CM

## 2013-02-26 NOTE — Telephone Encounter (Signed)
Patient called the office at 10:20 because she had not received a call back. Call was never routed to [place] from [facility]. I spoke with Oran Rein, who is familiar with this pt. Per PC, pt to be seen at 1:00 today. Pt aware. Encounter closed.

## 2013-02-26 NOTE — Progress Notes (Signed)
Subjective:    Patient ID: Bonnie Bates, female    DOB: 08/06/1976, 37 y.o.   MRN: [id number]  HPI  Pt is a 37 year old white female who presents to PCP for reoccurring episodes of chest discomfort. Pt states these symptoms have been occuring periodically x 1 year. States pain is confined to L side of chest and sometimes radiates to L shoulder. Pain is worsened with driving. Described as a "tight squeezing". States pain last for a few seconds and occurs at irregular intervals but daily1.Marland Kitchen Pt did receive temporary relief when started on a beta blocker, but this only lasted for about 10 days.   Review of Systems  Constitutional: Negative.   HENT: Negative.   Respiratory: Negative.   Cardiovascular: Positive for chest pain. Negative for palpitations and leg swelling.  Gastrointestinal: Negative.   Endocrine: Negative.   Genitourinary: Negative.   Musculoskeletal: Negative.   Skin: Negative.   Neurological: Negative.   Hematological: Negative.   Psychiatric/Behavioral: Negative.        Past Medical History  Diagnosis Date  . Newborn product of IVF pregnancy   . Hyperlipidemia     History   Social History  . Marital Status: Married    Spouse Name: N/A    Number of Children: 2  . Years of Education: 14   Occupational History  . [employer]    Social History Main Topics  . Smoking status: Never Smoker   . Smokeless tobacco: Never Used  . Alcohol Use: 0.5 oz/week    1 drink(s) per week     Comment: rare--< 1 a week  . Drug Use: No  . Sexually Active: Yes -- Female partner(s)   Other Topics Concern  . Not on file   Social History Narrative  . No narrative on file    Past Surgical History  Procedure Laterality Date  . Wisdom tooth extraction    . Ovarian cyst removal    . Varicose vein surgery      Right Leg via Laser    Family History  Problem Relation Age of Onset  . Heart attack Father     smoker  . Sudden death Father    . Heart disease Father 54    MI---second one at 64 and died  . Heart disease Maternal Grandfather   . Stroke Maternal Grandfather   . Hypertension Maternal Grandfather   . Kidney disease Maternal Grandfather   . Diabetes Maternal Grandmother   . Colon cancer Neg Hx     No Known Allergies  Current Outpatient Prescriptions on File Prior to Visit  Medication Sig Dispense Refill  . cetirizine (ZYRTEC) 10 MG tablet Take 10 mg by mouth daily.      . nadolol (CORGARD) 20 MG tablet 1 by mouth twice a day  200 tablet  3   No current facility-administered medications on file prior to visit.    BP 100/70  Pulse 70  Wt 215 lb (97.523 kg)  BMI 34.72 kg/m2  SpO2 98%chart Objective:   Physical Exam  Constitutional: She is oriented to person, place, and time. She appears well-developed and well-nourished.  HENT:  Right Ear: External ear normal.  Left Ear: External ear normal.  Nose: Nose normal.  Mouth/Throat: Oropharynx is clear and moist.  Neck: Normal range of motion. Neck supple. No thyromegaly present.  Cardiovascular: Normal rate, regular rhythm and normal heart sounds.   Pulmonary/Chest: Effort normal and breath sounds normal.  Abdominal: Soft.  Bowel sounds are normal.  Musculoskeletal: Normal range of motion.  Neurological: She is alert and oriented to person, place, and time.  Skin: Skin is warm and dry.  Psychiatric: She has a normal mood and affect.          Assessment & Plan:  1. Chest discomfort  Out of concern for pt potentially becoming hypotensive pt's beta blocker dose was not adjusted. Pt educated on avoiding stimulants such as caffeine, which she has been abstaining from. Pt will be placed on a holter monitor and referred to cardiology. Pt is to follow up with PCP with any questions/concerns or worsening of symptoms.   Note by Davonna Belling, FNP Student

## 2013-02-26 NOTE — Patient Instructions (Addendum)
Chest Pain (Nonspecific) °It is often hard to give a specific diagnosis for the cause of chest pain. There is always a chance that your pain could be related to something serious, such as a heart attack or a blood clot in the lungs. You need to follow up with your caregiver for further evaluation. °CAUSES  °· Heartburn. °· Pneumonia or bronchitis. °· Anxiety or stress. °· Inflammation around your heart (pericarditis) or lung (pleuritis or pleurisy). °· A blood clot in the lung. °· A collapsed lung (pneumothorax). It can develop suddenly on its own (spontaneous pneumothorax) or from injury (trauma) to the chest. °· Shingles infection (herpes zoster virus). °The chest wall is composed of bones, muscles, and cartilage. Any of these can be the source of the pain. °· The bones can be bruised by injury. °· The muscles or cartilage can be strained by coughing or overwork. °· The cartilage can be affected by inflammation and become sore (costochondritis). °DIAGNOSIS  °Lab tests or other studies, such as X-rays, electrocardiography, stress testing, or cardiac imaging, may be needed to find the cause of your pain.  °TREATMENT  °· Treatment depends on what may be causing your chest pain. Treatment may include: °· Acid blockers for heartburn. °· Anti-inflammatory medicine. °· Pain medicine for inflammatory conditions. °· Antibiotics if an infection is present. °· You may be advised to change lifestyle habits. This includes stopping smoking and avoiding alcohol, caffeine, and chocolate. °· You may be advised to keep your head raised (elevated) when sleeping. This reduces the chance of acid going backward from your stomach into your esophagus. °· Most of the time, nonspecific chest pain will improve within 2 to 3 days with rest and mild pain medicine. °HOME CARE INSTRUCTIONS  °· If antibiotics were prescribed, take your antibiotics as directed. Finish them even if you start to feel better. °· For the next few days, avoid physical  activities that bring on chest pain. Continue physical activities as directed. °· Do not smoke. °· Avoid drinking alcohol. °· Only take over-the-counter or prescription medicine for pain, discomfort, or fever as directed by your caregiver. °· Follow your caregiver's suggestions for further testing if your chest pain does not go away. °· Keep any follow-up appointments you made. If you do not go to an appointment, you could develop lasting (chronic) problems with pain. If there is any problem keeping an appointment, you must call to reschedule. °SEEK MEDICAL CARE IF:  °· You think you are having problems from the medicine you are taking. Read your medicine instructions carefully. °· Your chest pain does not go away, even after treatment. °· You develop a rash with blisters on your chest. °SEEK IMMEDIATE MEDICAL CARE IF:  °· You have increased chest pain or pain that spreads to your arm, neck, jaw, back, or abdomen. °· You develop shortness of breath, an increasing cough, or you are coughing up blood. °· You have severe back or abdominal pain, feel nauseous, or vomit. °· You develop severe weakness, fainting, or chills. °· You have a fever. °THIS IS AN EMERGENCY. Do not wait to see if the pain will go away. Get medical help at once. Call your local emergency services (911 in [place] Do not drive yourself to the hospital. °MAKE SURE YOU:  °· Understand these instructions. °· Will watch your condition. °· Will get help right away if you are not doing well or get worse. °Document Released: 05/04/2005 Document Revised: 10/17/2011 Document Reviewed: 02/28/2008 °ExitCare® Patient Information ©2014 [employer]   [employer]. ° °

## 2013-02-26 NOTE — Telephone Encounter (Signed)
Patient Information:  Caller Name: Aryona  Phone: [phone]  Patient: Bonnie Bates  Gender: Female  DOB: 07-03-1976  Age: 37 Years  PCP: Kelle Darting [facility])  Pregnant: No  Office Follow Up:  Does the office need to follow up with this patient?: Yes  Instructions For The Office: Patient states she prefers to be seen in middle of the day and has intermittent, chronic chest pain rated at 4/10.  She would like appointment with Lubertha Sayres or referral to Cardiologist if possible.   Symptoms  Reason For Call & Symptoms: Patient calling.  States she was put on Beta Blocker in May 2014; remarked "it worked for about 10 days" in that chest pain resolved and heart rate was great".  Symptoms returned; worse since 02/24/13.    Pain rated at 4/ 10, intermittent and not related to activity.  Onset symptoms "about a year".  On Depoprovera injections; LMP unknown; home pregnancy test negative.  Emergent symptoms ruled out.  Go to ED Now (or to Office with PCP Approval) per Chest Pain protocol due to "Intermittent chest pain and pain has been increasing in severity or frequency.  Parameters for callback given. Note to  Reviewed Health History In EMR: Yes  Reviewed Medications In EMR: Yes  Reviewed Allergies In EMR: Yes  Reviewed Surgeries / Procedures: Yes  Date of Onset of Symptoms: Unknown  Treatments Tried: Corgard  Treatments Tried Worked: No OB / GYN:  LMP: Unknown  Guideline(s) Used:  Chest Pain  Disposition Per Guideline:   Go to ED Now (or to Office with PCP Approval)  Reason For Disposition Reached:   Intermittent chest pain and pain has been increasing in severity or frequency  Advice Given:  Call Back If:  Constant chest pain lasting longer than 5 minutes  Difficulty breathing  You become worse.  Patient Refused Recommendation:  Patient Refused Appt, Patient Requests Appt At Later Date  Reqiests appointment in middle of the day.

## 2013-03-06 ENCOUNTER — Encounter: Payer: Self-pay | Admitting: *Deleted

## 2013-03-06 ENCOUNTER — Encounter (INDEPENDENT_AMBULATORY_CARE_PROVIDER_SITE_OTHER): Payer: BC Managed Care – PPO

## 2013-03-06 DIAGNOSIS — R079 Chest pain, unspecified: Secondary | ICD-10-CM

## 2013-03-06 DIAGNOSIS — R0789 Other chest pain: Secondary | ICD-10-CM

## 2013-03-06 NOTE — Progress Notes (Signed)
Patient ID: Bonnie Bates, female   DOB: 1976/02/10, 37 y.o.   MRN: [id number] E-Cardio 24 Hour Holter monitor applied to patient.

## 2013-03-08 ENCOUNTER — Encounter: Payer: Self-pay | Admitting: Family

## 2013-03-08 ENCOUNTER — Ambulatory Visit (INDEPENDENT_AMBULATORY_CARE_PROVIDER_SITE_OTHER): Payer: BC Managed Care – PPO | Admitting: Family

## 2013-03-08 VITALS — BP 100/60 | HR 82 | Ht 66.0 in | Wt 217.0 lb

## 2013-03-08 DIAGNOSIS — N63 Unspecified lump in unspecified breast: Secondary | ICD-10-CM

## 2013-03-08 DIAGNOSIS — N6320 Unspecified lump in the left breast, unspecified quadrant: Secondary | ICD-10-CM

## 2013-03-08 DIAGNOSIS — Z0000 Encounter for general adult medical examination without abnormal findings: Secondary | ICD-10-CM

## 2013-03-08 DIAGNOSIS — Z124 Encounter for screening for malignant neoplasm of cervix: Secondary | ICD-10-CM

## 2013-03-08 NOTE — Patient Instructions (Signed)
Fibrocystic Breast Changes  Fibrocystic breast changes is a non-cancerous(benign) condition that about half of all women have at some time in their life. It is also called benign breast disease and mammary dysplasia. It may also be called fibrocystic breast disease, but it is not really a disease. It is a common condition that occurs when women go through the hormonal changes during their menstrual cycle, between the ages of 20 to 50. Menopausal women do not have this problem, unless they are on hormone therapy. It can affect one or both breasts. This is not a sign that you will later get cancer.  CAUSES   Overgrowth of cells lining the milk ducts, or enlarged lobules in the breast, cause the breast duct to become blocked. The duct then fills up with fluid. This is like a small balloon filled with water. It is called a cyst. Over time, with repeated inflammation there is a tendency to form scar tissue. This scar tissue becomes the fibrous part of fibrocystic disease. The exact cause of this happening is not known, but it may be related to the female hormones, estrogen and progesterone. Heredity (genetics) may also be a factor in some cases.  SYMPTOMS    Tenderness.   Swelling.   Rope-like feeling.   Lumpy breast, one or both sides.   Changes in the size of the breasts, before and after the menstrual period (larger before, smaller after).   Green or dark brown nipple discharge (not blood).  Symptoms are usually worse before periods (menstrual cycle) and get better toward the end of menstruation. Usually, it is temporary minor discomfort. But some women have severe pain.   DIAGNOSIS   Check your breasts monthly. The best time to check your breasts is after your period. If you check them during your period, you are more likely to feel the normal glands enlarged, as a result of the hormonal changes that happen right before your period. If you do not have menstrual periods, check your breasts the first day of every  month. Become familiar with the way your own breasts feel. It is then easier to notice if there are changes, such as more tenderness, a new growth, change in breast size, or a change in a lump that has always been there. All breasts lumps need to be investigated, to rule out breast cancer. See your caregiver as soon as possible, if you find a lump. Most breast lumps are not cancerous. Excellent treatment is available for ones that are.   To make a diagnosis, your caregiver will examine your breasts and may recommend other tests, such as:   Mammogram (breast X-ray).   Ultrasound.   MRI (magnetic resonance imaging).   Removing fluid from the cyst with a fine needle, under local anesthesia (aspiration).   Taking a breast tissue sample (breast biopsy).  Some questions your caregiver will ask are:   What was the date of your last period?   When did the lump show up?   Is there any discharge from your breast?   Is the breast tender or painful?   Are the symptoms in one or both breasts?   Has the lump changed in size from month-to-month? How long has it been present?   Any family history of breast problems?   Any past breast problems?   Any history of breast surgery?   Are you taking any medications?   When was your last mammogram, and where was it done?  TREATMENT      Dietary changes help to prevent or reduce the symptoms of fibrocystic breast changes.   You may need to stop consuming all foods that contain caffeine, such as chocolate, sodas, coffee, and tea.   Reducing sugar and fat in your diet may also help.   Decrease estrogen in your diet. Some sources include commercially raised meats which contain estrogen. Eliminate other natural estrogens.   Birth control pills can also make symptoms worse.   Natural progesterone cream, applied at a dose of 15 to 20 milligrams per day, from ovulation until a day or two before your period returns, may help with returning to normal breast tissue over several  months. Seek advice from your caregiver.   Over-the-counter pain pills may help, as recommended by your caregiver.   Danazol hormone (female-like hormone) is sometimes used. It may cause hair growth and acne.   Needle aspiration can be used, to remove fluid from the cyst.   Surgery may be needed, to remove a large, persistent, and tender cyst.   Evening primrose oil may help with the tenderness and pain. It has linolenic acid that women may not have enough of.  HOME CARE INSTRUCTIONS    Examine your breasts after every menstrual period.   If you do not have menstrual periods, examine your breasts the first day of every month.   Wear a firm support bra, especially when exercising.   Decrease or avoid caffeine in your diet.   Decrease the fat and sugar in your diet.   Eat a balanced diet.   Try to see your caregiver after you have a menstrual period.   Before seeing your caregiver, make notes about:   When you have the symptoms.   What types of symptoms you are having.   Medications you are taking.   When and where your last mammogram was taken.   Past breast problems or breast surgery.  SEEK MEDICAL CARE IF:    You have been diagnosed with fibrocystic breast changes, and you develop changes in your breast:   Discharge from the nipple, especially bloody discharge.   Pain in the breast that does not go away after your menstrual period.   New lumps or bumps in the breast.   Lumps in your armpit.   Your breast or breasts become enlarged, red, and painful.   You find an isolated lump, even if it is not tender.   You have questions about this condition that have not been answered.  Document Released: 05/11/2006 Document Revised: 10/17/2011 Document Reviewed: 08/05/2009  Exit[employer] Patient Information 2014 [employer].

## 2013-03-08 NOTE — Progress Notes (Signed)
Subjective:    Patient ID: Bonnie Bates, female    DOB: 1976/03/15, 37 y.o.   MRN: [id number]  HPI This is a routine physical examination for this healthy  Female. Reviewed all health maintenance protocols including mammography and reviewed appropriate screening labs. Her immunization history was reviewed as well as her current medications and allergies refills of her chronic medications were given and the plan for yearly health maintenance was discussed all orders and referrals were made as appropriate.   Review of Systems  Constitutional: Negative.   HENT: Negative.   Eyes: Negative.   Respiratory: Negative.   Cardiovascular: Negative.   Gastrointestinal: Negative.   Endocrine: Negative.   Genitourinary: Negative.   Musculoskeletal: Negative.   Skin: Negative.   Allergic/Immunologic: Negative.   Neurological: Negative.   Hematological: Negative.   Psychiatric/Behavioral: Negative.    Past Medical History  Diagnosis Date  . Newborn product of IVF pregnancy   . Hyperlipidemia     History   Social History  . Marital Status: Married    Spouse Name: N/A    Number of Children: 2  . Years of Education: 14   Occupational History  . [employer]    Social History Main Topics  . Smoking status: Never Smoker   . Smokeless tobacco: Never Used  . Alcohol Use: 0.5 oz/week    1 drink(s) per week     Comment: rare--< 1 a week  . Drug Use: No  . Sexually Active: Yes -- Female partner(s)   Other Topics Concern  . Not on file   Social History Narrative  . No narrative on file    Past Surgical History  Procedure Laterality Date  . Wisdom tooth extraction    . Ovarian cyst removal    . Varicose vein surgery      Right Leg via Laser    Family History  Problem Relation Age of Onset  . Heart attack Father     smoker  . Sudden death Father   . Heart disease Father 26    MI---second one at 105 and died  . Heart disease Maternal  Grandfather   . Stroke Maternal Grandfather   . Hypertension Maternal Grandfather   . Kidney disease Maternal Grandfather   . Diabetes Maternal Grandmother   . Colon cancer Neg Hx     No Known Allergies  Current Outpatient Prescriptions on File Prior to Visit  Medication Sig Dispense Refill  . cetirizine (ZYRTEC) 10 MG tablet Take 10 mg by mouth daily.      . nadolol (CORGARD) 20 MG tablet 1 by mouth twice a day  200 tablet  3   No current facility-administered medications on file prior to visit.    BP 100/60  Pulse 82  Ht 5\' 6"  (1.676 m)  Wt 217 lb (98.431 kg)  BMI 35.04 kg/m2  SpO2 98%chart    Objective:   Physical Exam  Constitutional: She is oriented to person, place, and time. She appears well-developed and well-nourished.  HENT:  Head: Normocephalic.  Right Ear: External ear normal.  Left Ear: External ear normal.  Nose: Nose normal.  Mouth/Throat: Oropharynx is clear and moist.  Eyes: Conjunctivae and EOM are normal. Pupils are equal, round, and reactive to light.  Neck: Normal range of motion. Neck supple.  Cardiovascular: Normal rate, regular rhythm and normal heart sounds.   Pulmonary/Chest: Effort normal and breath sounds normal. Right breast exhibits no inverted nipple, no mass, no nipple discharge,  no skin change and no tenderness. Left breast exhibits mass. Left breast exhibits no inverted nipple, no nipple discharge, no skin change and no tenderness.    Abdominal: Soft. Bowel sounds are normal.  Genitourinary: Vagina normal.  Musculoskeletal: Normal range of motion.  Neurological: She is alert and oriented to person, place, and time. She has normal reflexes.  Skin: Skin is warm and dry.  Psychiatric: She has a normal mood and affect.          Assessment & Plan:  Assessment:  1. CPX 2.Pap Smear  Plan: See cardiology as scheduled. Monthly self breast exams. Diagnostic breast mammogram with ultrasound to evaluate the mass. Call the office with any  questions or concerns. Recheck as scheduled and as needed.

## 2013-03-25 ENCOUNTER — Ambulatory Visit
Admission: RE | Admit: 2013-03-25 | Discharge: 2013-03-25 | Disposition: A | Discharge status: Home / Self Care | Payer: BC Managed Care – PPO | Source: Ambulatory Visit | Attending: Family | Admitting: Family

## 2013-03-25 ENCOUNTER — Encounter: Payer: BC Managed Care – PPO | Admitting: Family Medicine

## 2013-03-25 DIAGNOSIS — N6320 Unspecified lump in the left breast, unspecified quadrant: Secondary | ICD-10-CM

## 2013-03-26 ENCOUNTER — Ambulatory Visit: Payer: BC Managed Care – PPO | Admitting: Cardiology

## 2013-04-03 ENCOUNTER — Encounter: Payer: Self-pay | Admitting: Family

## 2013-06-13 ENCOUNTER — Other Ambulatory Visit: Payer: Self-pay

## 2014-01-02 ENCOUNTER — Other Ambulatory Visit: Payer: Self-pay | Admitting: Family Medicine

## 2014-01-02 DIAGNOSIS — R000 Tachycardia, unspecified: Secondary | ICD-10-CM

## 2014-01-03 ENCOUNTER — Telehealth: Payer: Self-pay | Admitting: Family Medicine

## 2014-01-03 NOTE — Telephone Encounter (Addendum)
Pt would like this refill of nadolol (CORGARD) 20 MG tablet  30 w/ refills But do NOT send to [facility] pls send to [facility], [place]   [phone]

## 2014-01-03 NOTE — Telephone Encounter (Signed)
Ok with me 

## 2014-01-03 NOTE — Telephone Encounter (Signed)
Pt would like to switch to padonda . Is that ok w/ you dr todd? Pt  states she is very comfortable  W/ padonda and prefers female. Ok w/ you Padonda?

## 2014-01-06 MED ORDER — NADOLOL 20 MG PO TABS: ORAL_TABLET | ORAL | Status: DC

## 2014-01-09 NOTE — Telephone Encounter (Signed)
Okay per Dr Todd 

## 2014-01-20 ENCOUNTER — Encounter (HOSPITAL_COMMUNITY): Payer: Self-pay | Admitting: Emergency Medicine

## 2014-01-20 ENCOUNTER — Telehealth: Payer: Self-pay | Admitting: Family Medicine

## 2014-01-20 ENCOUNTER — Emergency Department (HOSPITAL_COMMUNITY)
Admission: EM | Admit: 2014-01-20 | Discharge: 2014-01-20 | Disposition: A | Discharge status: Home / Self Care | Payer: BC Managed Care – PPO | Attending: Emergency Medicine | Admitting: Emergency Medicine

## 2014-01-20 DIAGNOSIS — Z8719 Personal history of other diseases of the digestive system: Secondary | ICD-10-CM | POA: Insufficient documentation

## 2014-01-20 DIAGNOSIS — R0789 Other chest pain: Secondary | ICD-10-CM | POA: Insufficient documentation

## 2014-01-20 DIAGNOSIS — R209 Unspecified disturbances of skin sensation: Secondary | ICD-10-CM | POA: Insufficient documentation

## 2014-01-20 DIAGNOSIS — Z862 Personal history of diseases of the blood and blood-forming organs and certain disorders involving the immune mechanism: Secondary | ICD-10-CM | POA: Insufficient documentation

## 2014-01-20 DIAGNOSIS — R079 Chest pain, unspecified: Secondary | ICD-10-CM

## 2014-01-20 DIAGNOSIS — R202 Paresthesia of skin: Secondary | ICD-10-CM

## 2014-01-20 DIAGNOSIS — Z8639 Personal history of other endocrine, nutritional and metabolic disease: Secondary | ICD-10-CM | POA: Insufficient documentation

## 2014-01-20 DIAGNOSIS — F411 Generalized anxiety disorder: Secondary | ICD-10-CM | POA: Insufficient documentation

## 2014-01-20 DIAGNOSIS — Z79899 Other long term (current) drug therapy: Secondary | ICD-10-CM | POA: Insufficient documentation

## 2014-01-20 HISTORY — DX: Tachycardia, unspecified: R00.0

## 2014-01-20 HISTORY — DX: Gastritis, unspecified, without bleeding: K29.70

## 2014-01-20 LAB — BASIC METABOLIC PANEL
BUN: 10 mg/dL (ref 6–23)
CALCIUM: 9.5 mg/dL (ref 8.4–10.5)
CO2: 25 mEq/L (ref 19–32)
Chloride: 105 mEq/L (ref 96–112)
Creatinine, Ser: 0.74 mg/dL (ref 0.50–1.10)
GFR calc Af Amer: >90 mL/min (ref >90)
Glucose, Bld: 97 mg/dL (ref 70–99)
Potassium: 4.1 mEq/L (ref 3.7–5.3)
SODIUM: 141 meq/L (ref 137–147)

## 2014-01-20 LAB — I-STAT TROPONIN, ED: Troponin i, poc: 0 ng/mL (ref 0.00–0.08)

## 2014-01-20 LAB — CBC
HCT: 42.2 % (ref 36.0–46.0)
HEMOGLOBIN: 14.6 g/dL (ref 12.0–15.0)
MCH: 30 pg (ref 26.0–34.0)
MCHC: 34.6 g/dL (ref 30.0–36.0)
MCV: 86.7 fL (ref 78.0–100.0)
Platelets: 201 10*3/uL (ref 150–400)
RBC: 4.87 MIL/uL (ref 3.87–5.11)
RDW: 12.8 % (ref 11.5–15.5)
WBC: 7.6 10*3/uL (ref 4.0–10.5)

## 2014-01-20 NOTE — ED Notes (Signed)
Pt reports left sided chest pain ongoing for 1 year. Pt also c/o intermittent left arm numbness x 1 week. Pt reports abdominal pain with n/v [place].

## 2014-01-20 NOTE — Telephone Encounter (Signed)
Patient Information:  Caller Name: Bonnie Bates  Phone: [phone]  Patient: Bonnie Bates, Bonnie Bates  Gender: Female  DOB: 1976-04-17  Age: [age] Years  PCP: Adline Mangoampbell, Padonda [facility](Family Practice)  Pregnant: No  Office Follow Up:  Does the office need to follow up with this patient?: Yes  Instructions For The Office: Please follow up with patient regarding possible appointment at office vs ED for her episode of chest heaviness, numbness and pressure in left shoulder to fingertips.   Symptoms  Reason For Call & Symptoms: Patient calling about chest pains onset "about a year ago".  She has been previously evaluated for same.  Pain intermittent.  Back and abdominal pain, vomiting 01/15/14 [facility] [place]; diagnosed with gastritis.  Tingling in left arm  from shoulder to finger tips  on 01/20/14 on the way to work, legs feel heavy, "just don't feel right".  She reports getting hot/ sweaty following this episode.  Her father died at age 39 from MI and had first MI at 38 years of age.  Pain in left shoulder "a few minutes - comes and goes".  It is not severe.  Caller unsure if any cardiac enzymes were checked last week.  Advised ED per Chest Pain guideline due to Pain also present in shoulder or arm or jaw.  Reviewed Health History In EMR: Yes  Reviewed Medications In EMR: Yes  Reviewed Allergies In EMR: Yes  Reviewed Surgeries / Procedures: Yes  Date of Onset of Symptoms: 01/15/2014 OB / GYN:  LMP: 12/26/2013  Guideline(s) Used:  Chest Pain  Disposition Per Guideline:   Go to ED Now  Reason For Disposition Reached:   Pain also present in shoulder(s) or arm(s) or jaw  Advice Given:  Call Back If:  Severe chest pain  Constant chest pain lasting longer than 5 minutes  Difficulty breathing  You become worse.  Patient Refused Recommendation:  Patient Refused Care Advice  Patient states pain is not severe and she feels that she could drive herself.  She refuses to go to ED for  fear of being brushed off due to history of similar symptoms intermittently for a year.

## 2014-01-20 NOTE — Telephone Encounter (Signed)
Pt decide to go to [place]

## 2014-01-20 NOTE — Telephone Encounter (Signed)
Please call pt to schedule ER follow up

## 2014-01-20 NOTE — ED Notes (Signed)
Pt verbalizes understanding of d/c instructions and denies any further need at this time.  Pt prefers not to be wheeled out, walked out with RN instead.

## 2014-01-20 NOTE — Discharge Instructions (Signed)
The tests on your heart were, normal, today. There are several things that he should talk to your primary care doctor about. People, who have had palpitations, may have structural heart problems, that can be diagnosed with a cardiac echo. Your chest pain does not tend to indicate a cardiac problems. But with your family history of heart problems, and your high blood cholesterol, you are at low risk for cardiac problems.  This tends to indicate that you need a cardiac stress test. Call your PCP for a follow up appointment.     Chest Pain (Nonspecific) It is often hard to give a specific diagnosis for the cause of chest pain. There is always a chance that your pain could be related to something serious, such as a heart attack or a blood clot in the lungs. You need to follow up with your caregiver for further evaluation. CAUSES   Heartburn.  Pneumonia or bronchitis.  Anxiety or stress.  Inflammation around your heart (pericarditis) or lung (pleuritis or pleurisy).  A blood clot in the lung.  A collapsed lung (pneumothorax). It can develop suddenly on its own (spontaneous pneumothorax) or from injury (trauma) to the chest.  Shingles infection (herpes zoster virus). The chest wall is composed of bones, muscles, and cartilage. Any of these can be the source of the pain.  The bones can be bruised by injury.  The muscles or cartilage can be strained by coughing or overwork.  The cartilage can be affected by inflammation and become sore (costochondritis). DIAGNOSIS  Lab tests or other studies, such as X-rays, electrocardiography, stress testing, or cardiac imaging, may be needed to find the cause of your pain.  TREATMENT   Treatment depends on what may be causing your chest pain. Treatment may include:  Acid blockers for heartburn.  Anti-inflammatory medicine.  Pain medicine for inflammatory conditions.  Antibiotics if an infection is present.  You may be advised to change  lifestyle habits. This includes stopping smoking and avoiding alcohol, caffeine, and chocolate.  You may be advised to keep your head raised (elevated) when sleeping. This reduces the chance of acid going backward from your stomach into your esophagus.  Most of the time, nonspecific chest pain will improve within 2 to 3 days with rest and mild pain medicine. HOME CARE INSTRUCTIONS   If antibiotics were prescribed, take your antibiotics as directed. Finish them even if you start to feel better.  For the next few days, avoid physical activities that bring on chest pain. Continue physical activities as directed.  Do not smoke.  Avoid drinking alcohol.  Only take over-the-counter or prescription medicine for pain, discomfort, or fever as directed by your caregiver.  Follow your caregiver's suggestions for further testing if your chest pain does not go away.  Keep any follow-up appointments you made. If you do not go to an appointment, you could develop lasting (chronic) problems with pain. If there is any problem keeping an appointment, you must call to reschedule. SEEK MEDICAL CARE IF:   You think you are having problems from the medicine you are taking. Read your medicine instructions carefully.  Your chest pain does not go away, even after treatment.  You develop a rash with blisters on your chest. SEEK IMMEDIATE MEDICAL CARE IF:   You have increased chest pain or pain that spreads to your arm, neck, jaw, back, or abdomen.  You develop shortness of breath, an increasing cough, or you are coughing up blood.  You have severe back or  abdominal pain, feel nauseous, or vomit.  You develop severe weakness, fainting, or chills.  You have a fever. THIS IS AN EMERGENCY. Do not wait to see if the pain will go away. Get medical help at once. Call your local emergency services (911 in [place] Do not drive yourself to the hospital. MAKE SURE YOU:   Understand these instructions.  Will  watch your condition.  Will get help right away if you are not doing well or get worse. Document Released: 05/04/2005 Document Revised: 10/17/2011 Document Reviewed: 02/28/2008 [facility] Patient Information 2014 [place] [place].  Paresthesia Paresthesia is an abnormal burning or prickling sensation. This sensation is generally felt in the hands, arms, legs, or feet. However, it may occur in any part of the body. It is usually not painful. The feeling may be described as:  Tingling or numbness.  "Pins and needles."  Skin crawling.  Buzzing.  Limbs "falling asleep."  Itching. Most people experience temporary (transient) paresthesia at some time in their lives. CAUSES  Paresthesia may occur when you breathe too quickly (hyperventilation). It can also occur without any apparent cause. Commonly, paresthesia occurs when pressure is placed on a nerve. The feeling quickly goes away once the pressure is removed. For some people, however, paresthesia is a long-lasting (chronic) condition caused by an underlying disorder. The underlying disorder may be:  A traumatic, direct injury to nerves. Examples include a:  Broken (fractured) neck.  Fractured skull.  A disorder affecting the brain and spinal cord (central nervous system). Examples include:  Transverse myelitis.  Encephalitis.  Transient ischemic attack.  Multiple sclerosis.  Stroke.  Tumor or blood vessel problems, such as an arteriovenous malformation pressing against the brain or spinal cord.  A condition that damages the peripheral nerves (peripheral neuropathy). Peripheral nerves are not part of the brain and spinal cord. These conditions include:  Diabetes.  Peripheral vascular disease.  Nerve entrapment syndromes, such as carpal tunnel syndrome.  Shingles.  Hypothyroidism.  Vitamin B12 deficiencies.  Alcoholism.  Heavy metal poisoning (lead, arsenic).  Rheumatoid arthritis.  Systemic lupus  erythematosus. DIAGNOSIS  Your caregiver will attempt to find the underlying cause of your paresthesia. Your caregiver may:  Take your medical history.  Perform a physical exam.  Order various lab tests.  Order imaging tests. TREATMENT  Treatment for paresthesia depends on the underlying cause. HOME CARE INSTRUCTIONS  Avoid drinking alcohol.  You may consider massage or acupuncture to help relieve your symptoms.  Keep all follow-up appointments as directed by your caregiver. SEEK IMMEDIATE MEDICAL CARE IF:   You feel weak.  You have trouble walking or moving.  You have problems with speech or vision.  You feel confused.  You cannot control your bladder or bowel movements.  You feel numbness after an injury.  You faint.  Your burning or prickling feeling gets worse when walking.  You have pain, cramps, or dizziness.  You develop a rash. MAKE SURE YOU:  Understand these instructions.  Will watch your condition.  Will get help right away if you are not doing well or get worse. Document Released: 07/15/2002 Document Revised: 10/17/2011 Document Reviewed: 04/15/2011 [facility] Patient Information 2014 [place].

## 2014-01-20 NOTE — ED Provider Notes (Signed)
CSN: [id number]     Arrival date & time 01/20/14  1029 History   First MD Initiated Contact with Patient 01/20/14 1509     Chief Complaint  Patient presents with  . Chest Pain     (Consider location/radiation/quality/duration/timing/severity/associated sxs/prior Treatment) Patient is a 38 y.o. female presenting with chest pain. The history is provided by the patient.  Chest Pain  She is here for evaluation of tingling in her hands, and ongoing chest pain. She was at work. This morning, when she noticed a tingling sensation in her entire left arm and her right hand. The sensation was transient. She also had some chest pain today, which was not necessarily associated with the tingling sensation. She has had tingling similar in the past, but it never lasted as long as it did today. Today, the tingling was there for several hours. She has intermittent chest pain 2-4 times a day that lasts several minutes at a time, and occurs spontaneously, and resolves spontaneously. She has been evaluated for the chest pain in the past by her PCP and told that it was musculoskeletal. She is also had sensation of palpitations and has been treated with nadolol. The palpitations, cause chest pain, as well. When she skips doses of Nadolol, she tends to have more chest pain. She has not had cardiac stress testing. She has elevated cholesterol level and was put on statins, but stopped them, voluntarily. Her father died of MI at age 39, he was told that he had cardiac blockages. She had an episode of gastritis over the weekend and is taking Zantac for it. There are no other known modifying factors.  Past Medical History  Diagnosis Date  . Newborn product of IVF pregnancy   . Hyperlipidemia   . Gastritis   . Rapid heart rate    Past Surgical History  Procedure Laterality Date  . Wisdom tooth extraction    . Ovarian cyst removal    . Varicose vein surgery      Right Leg via Laser   Family History  Problem Relation  Age of Onset  . Heart attack Father     smoker  . Sudden death Father   . Heart disease Father 5439    MI---second one at 4745 and died  . Heart disease Maternal Grandfather   . Stroke Maternal Grandfather   . Hypertension Maternal Grandfather   . Kidney disease Maternal Grandfather   . Diabetes Maternal Grandmother   . Colon cancer Neg Hx    History  Substance Use Topics  . Smoking status: Never Smoker   . Smokeless tobacco: Never Used  . Alcohol Use: 0.5 oz/week    1 drink(s) per week     Comment: rare--< 1 a week   OB History   Grav Para Term Preterm Abortions TAB SAB Ect Mult Living                 Review of Systems  Cardiovascular: Positive for chest pain.  All other systems reviewed and are negative.     Allergies  Codeine and Morphine and related  Home Medications   Prior to Admission medications   Medication Sig Start Date End Date Taking? Authorizing Provider  cetirizine (ZYRTEC) 10 MG tablet Take 10 mg by mouth daily as needed for allergies.    Yes Historical Provider, MD  ibuprofen (ADVIL,MOTRIN) 200 MG tablet Take 400 mg by mouth every 6 (six) hours as needed for mild pain.   Yes Historical Provider, MD  nadolol (CORGARD) 20 MG tablet Take 20 mg by mouth daily.   Yes Historical Provider, MD   BP 107/80  Pulse 74  Temp(Src) 98.1 F (36.7 C) (Oral)  Resp 19  Ht 5\' 6"  (1.676 m)  Wt 204 lb (92.534 kg)  BMI 32.94 kg/m2  SpO2 98%  LMP 12/26/2013 Physical Exam  Nursing note and vitals reviewed. Constitutional: She is oriented to person, place, and time. She appears well-developed and well-nourished.  HENT:  Head: Normocephalic and atraumatic.  Eyes: Conjunctivae and EOM are normal. Pupils are equal, round, and reactive to light.  Neck: Normal range of motion and phonation normal. Neck supple.  Cardiovascular: Normal rate, regular rhythm and intact distal pulses.   Pulmonary/Chest: Effort normal and breath sounds normal. No respiratory distress. She has  no wheezes. She exhibits no tenderness.  Abdominal: Soft. She exhibits no distension. There is no tenderness. There is no guarding.  Musculoskeletal: Normal range of motion.  Neurological: She is alert and oriented to person, place, and time. She exhibits normal muscle tone.  Skin: Skin is warm and dry.  Psychiatric: Her behavior is normal. Judgment and thought content normal.  She is anxious    ED Course  Procedures (including critical care time)  Medications - No data to display  Patient Vitals for the past 24 hrs:  BP Temp Temp src Pulse Resp SpO2 Height Weight  01/20/14 1500 107/80 mmHg - - 74 19 98 % - -  01/20/14 1440 120/75 mmHg - - 69 20 98 % - -  01/20/14 1309 117/76 mmHg - - 67 16 100 % - -  01/20/14 1043 116/73 mmHg 98.1 F (36.7 C) Oral 65 18 100 % - -  01/20/14 1040 - - - - - - 5\' 6"  (1.676 m) 204 lb (92.534 kg)    4:08 PM Reevaluation with update and discussion. After initial assessment and treatment, an updated evaluation reveals she is comfortable. Findings discussed with pt. And mother, all questions answered. Daleah Coulson L   Labs Review Labs Reviewed  CBC  BASIC METABOLIC PANEL  I-STAT TROPOININ, ED    Imaging Review No results found.   EKG Interpretation   Date/Time:  Monday January 20 2014 10:37:54 EDT Ventricular Rate:  73 PR Interval:  122 QRS Duration: 92 QT Interval:  372 QTC Calculation: 409 R Axis:   38 Text Interpretation:  Normal sinus rhythm with sinus arrhythmia Low  voltage QRS Borderline ECG No old tracing to compare Confirmed by [facility]   MD, Adelyne Marchese [phone]([id number]) on 01/20/2014 3:40:06 PM      MDM   Final diagnoses:  Chest pain  Paresthesia    Vague, ongoing CP for 1 year. Very low risk for coronary events. Nonspecific paresthesias. This constellation of sx in healthy female does not portend serious problems. She can be evaluated as OP by PCP with cardiac stress test, cardiac ECHO and expectant management.  Nursing Notes Reviewed/  Care Coordinated Applicable Imaging Reviewed Interpretation of Laboratory Data incorporated into ED treatment  The patient appears reasonably screened and/or stabilized for discharge and I doubt any other medical condition or other [facility] requiring further screening, evaluation, or treatment in the ED at this time prior to discharge.  Plan: Home Medications- usual; Home Treatments- rest; return here if the recommended treatment, does not improve the symptoms; Recommended follow up- PCP prn    Flint MelterElliott L Lauralyn Shadowens, MD 01/21/14 1245

## 2014-01-20 NOTE — Telephone Encounter (Signed)
Schedule FU Ov from the ED

## 2014-02-27 ENCOUNTER — Encounter: Payer: Self-pay | Admitting: *Deleted

## 2014-03-18 ENCOUNTER — Ambulatory Visit (INDEPENDENT_AMBULATORY_CARE_PROVIDER_SITE_OTHER): Payer: BC Managed Care – PPO | Admitting: Cardiology

## 2014-03-18 ENCOUNTER — Encounter: Payer: Self-pay | Admitting: Cardiology

## 2014-03-18 VITALS — BP 134/92 | HR 86 | Ht 66.0 in | Wt 212.9 lb

## 2014-03-18 DIAGNOSIS — E782 Mixed hyperlipidemia: Secondary | ICD-10-CM

## 2014-03-18 DIAGNOSIS — R079 Chest pain, unspecified: Secondary | ICD-10-CM | POA: Insufficient documentation

## 2014-03-18 DIAGNOSIS — R072 Precordial pain: Secondary | ICD-10-CM

## 2014-03-18 LAB — LIPID PANEL
CHOL/HDL RATIO: 3.8 ratio
Cholesterol: 234 mg/dL — ABNORMAL HIGH (ref 0–200)
HDL: 62 mg/dL (ref >39)
LDL Cholesterol: 152 mg/dL — ABNORMAL HIGH (ref 0–99)
Triglycerides: 101 mg/dL (ref <150)
VLDL: 20 mg/dL (ref 0–40)

## 2014-03-18 NOTE — Progress Notes (Signed)
HPI The patient presents for evaluation of chest pain. She's also had some palpitations. She did wear a monitor earlier this year for palpitations and there were some brief sinus pauses but no significant dysrhythmias. She was in the middle in June in the emergency room for chest discomfort. This was felt to be atypical and there was no evidence of tenia. I did review these records. She does describe chest discomfort sporadically. He is in the left axilla. It is squeezing. It lasts for a couple of minutes. It does not come on with activity. It may radiate up a little bit into the chest. It is 4/10 in intensity. There are no associated symptoms such as nausea vomiting or diaphoresis. She hasn't had any shortness of breath, PND or orthopnea. She's not describing palpitations, presyncope or syncope. She does do some exercising without bringing on any symptoms.  Allergies  Allergen Reactions  . Codeine Nausea And Vomiting  . Morphine And Related Nausea And Vomiting    Current Outpatient Prescriptions  Medication Sig Dispense Refill  . cetirizine (ZYRTEC) 10 MG tablet Take 10 mg by mouth daily as needed for allergies.       Marland Kitchen. ibuprofen (ADVIL,MOTRIN) 200 MG tablet Take 400 mg by mouth every 6 (six) hours as needed for mild pain.      . nadolol (CORGARD) 20 MG tablet Take 20 mg by mouth daily.       No current facility-administered medications for this visit.    Past Medical History  Diagnosis Date  . Newborn product of IVF pregnancy   . Hyperlipidemia   . Gastritis   . Rapid heart rate     Past Surgical History  Procedure Laterality Date  . Wisdom tooth extraction    . Ovarian cyst removal    . Varicose vein surgery      Right Leg via Laser    Family History  Problem Relation Age of Onset  . Heart attack Father     smoker  . Sudden death Father   . Heart disease Father 5439    MI---second one at 9045 and died  . Heart disease Maternal Grandfather   . Stroke Maternal Grandfather     . Hypertension Maternal Grandfather   . Kidney disease Maternal Grandfather   . Diabetes Maternal Grandmother   . Colon cancer Neg Hx     History   Social History  . Marital Status: Married    Spouse Name: N/A    Number of Children: 2  . Years of Education: 14   Occupational History  . [employer]--service coordinator    Social History Main Topics  . Smoking status: Never Smoker   . Smokeless tobacco: Never Used  . Alcohol Use: 0.5 oz/week    1 drink(s) per week     Comment: rare--< 1 a week  . Drug Use: No  . Sexual Activity: Yes    Partners: Male   Other Topics Concern  . Not on file   Social History Narrative  . No narrative on file    ROS:  And a positive for reflux, intermittent ankle swelling.  Otherwise as stated in the HPI and negative for all other systems.  PHYSICAL EXAM BP 134/92  Pulse 86  Ht 5\' 6"  (1.676 m)  Wt 212 lb 14.4 oz (96.571 kg)  BMI 34.38 kg/m2 GENERAL:  Well appearing HEENT:  Pupils equal round and reactive, fundi not visualized, oral mucosa unremarkable NECK:  No jugular venous  distention, waveform within normal limits, carotid upstroke brisk and symmetric, no bruits, no thyromegaly LYMPHATICS:  No cervical, inguinal adenopathy LUNGS:  Clear to auscultation bilaterally BACK:  No CVA tenderness CHEST:  Unremarkable HEART:  PMI not displaced or sustained,S1 and S2 within normal limits, no S3, no S4, no clicks, no rubs, no murmurs ABD:  Flat, positive bowel sounds normal in frequency in pitch, no bruits, no rebound, no guarding, no midline pulsatile mass, no hepatomegaly, no splenomegaly EXT:  2 plus pulses throughout, no edema, no cyanosis no clubbing SKIN:  No rashes no nodules NEURO:  Cranial nerves II through XII grossly intact, motor grossly intact throughout PSYCH:  Cognitively intact, oriented to person place and time  EKG:    Sinus rhythm, rate 73, axis within normal limits, intervals within normal limits, no acute  ST-T wave changes. 03/18/2014  ASSESSMENT AND PLAN  CHEST PAIN:  Atypical. However, she has significant cardiovascular risk factors. I will bring the patient back for a POET (Plain Old Exercise Test). This will allow me to screen for obstructive coronary disease, risk stratify and very importantly provide a prescription for exercise.  Also given the dramatic family history with the dyslipidemia below I will order a coronary calcium score to further guide therapy.  DYSLIPIDEMIA:  We had a long discussion about this. The LDL below reflects her efforts to treat with diet. Previous LDL was 190. She was by guidelines require therapy. I will however check another lipid level. I will likely then start a statin and we discussed the risk benefits of primary prevention. Some of our goal therapy will be based on the results of the calcium score is above  Lab Results  Component Value Date   CHOL 217* 12/24/2012   TRIG 148.0 12/24/2012   HDL 40.00 12/24/2012   LDLDIRECT 160.0 12/24/2012

## 2014-03-18 NOTE — Patient Instructions (Signed)
Your physician recommends that you schedule a follow-up appointment in: 2 years with Dr. Antoine PocheHochrein  We are ordering a ct calcium scoring test  We have ordered some labs for you to have done be sure that you are fasting  We are ordering an exercise test called a POET

## 2014-03-21 ENCOUNTER — Encounter: Payer: Self-pay | Admitting: Cardiology

## 2014-03-25 ENCOUNTER — Telehealth: Payer: Self-pay | Admitting: Cardiology

## 2014-03-25 NOTE — Telephone Encounter (Signed)
Returning your call. °

## 2014-03-27 ENCOUNTER — Other Ambulatory Visit: Payer: Self-pay | Admitting: *Deleted

## 2014-03-27 NOTE — Telephone Encounter (Signed)
Pt. Called and informed about her lipid panel and that Dr. Antoine PocheHochrein wanted to start her on a statin, pt. Stated that she talked about this with Dr. Antoine PocheHochrein and wanted to try diet and exercise again and recheck her lipids in six months

## 2014-04-01 ENCOUNTER — Other Ambulatory Visit: Payer: Self-pay | Admitting: *Deleted

## 2014-04-17 ENCOUNTER — Ambulatory Visit (INDEPENDENT_AMBULATORY_CARE_PROVIDER_SITE_OTHER): Payer: BC Managed Care – PPO | Admitting: Physician Assistant

## 2014-04-17 ENCOUNTER — Ambulatory Visit (INDEPENDENT_AMBULATORY_CARE_PROVIDER_SITE_OTHER)
Admission: RE | Admit: 2014-04-17 | Discharge: 2014-04-17 | Disposition: A | Discharge status: Home / Self Care | Payer: Self-pay | Source: Ambulatory Visit | Attending: Cardiology | Admitting: Cardiology

## 2014-04-17 DIAGNOSIS — R079 Chest pain, unspecified: Secondary | ICD-10-CM

## 2014-04-17 NOTE — Progress Notes (Signed)
Exercise Treadmill Test  Pre-Exercise Testing Evaluation Rhythm: sinus tachycardia  Rate: 98 bpm     Test  Exercise Tolerance Test Ordering MD: Angelina Sheriff, MD  Interpreting MD: Tereso Newcomer, PA-C  Unique Test No: 1  Treadmill:  1  Indication for ETT: chest pain - rule out ischemia  Contraindication to ETT: No   Stress Modality: exercise - treadmill  Cardiac Imaging Performed: non   Protocol: standard Bruce - maximal  Max BP:  197/73  Max MPHR (bpm):  181 85% MPR (bpm):  154  MPHR obtained (bpm):  181 % MPHR obtained:  100  Reached 85% MPHR (min:sec):  3:25 Total Exercise Time (min-sec):  6:00  Workload in METS:  7.0 Borg Scale: 14  Reason ETT Terminated:  desired heart rate attained    ST Segment Analysis At Rest: normal ST segments - no evidence of significant ST depression With Exercise: non-specific ST changes  Other Information Arrhythmia:  No Angina during ETT:  absent (0) Quality of ETT:  diagnostic  ETT Interpretation:  normal - no evidence of ischemia by ST analysis  Comments: Fair exercise capacity. No chest pain. Normal BP response to exercise. Non-specific ST changes.  No significant ST depression to suggest ischemia.  Excellent HR recovery in 1st minute post exercise.  Recommendations: FU with Dr. Rollene Rotunda as directed. Signed,  Tereso Newcomer, PA-C   04/17/2014 9:58 AM

## 2015-02-01 IMAGING — CT CT HEART SCORING
1 of 3 series · 10 of 20 positions shown, 13 images · non-contrast
Comparison: No priors.

CLINICAL DATA: Risk stratification

EXAM:
Coronary Calcium Score
TECHNIQUE: The patient was scanned on a Siemens Sensation 16 slice scanner.
Axial non-contrast 3 mm slices were carried out through the heart.
The data set was analyzed on a dedicated work station and scored
using the Agatson method.

[Series 6: st thins for reformat · axial · 0.68mm/px · z∈[-184,-98]mm · 10 of 106 slices shown, 13 images]
[im 10/106  vessel]
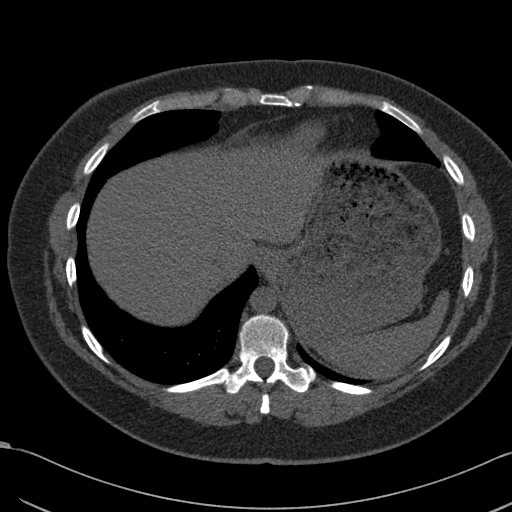
[im 10/106  lung]
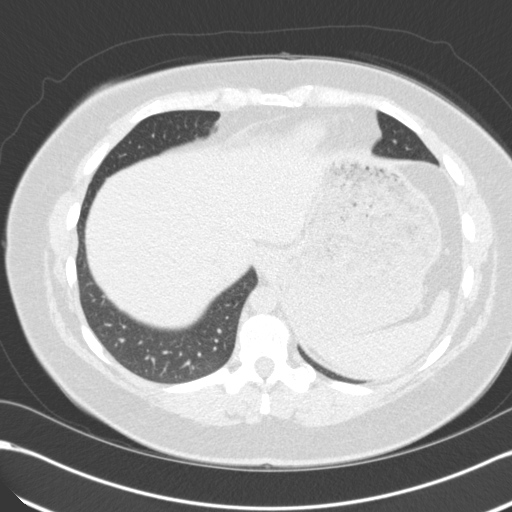
[im 20/106  vessel]
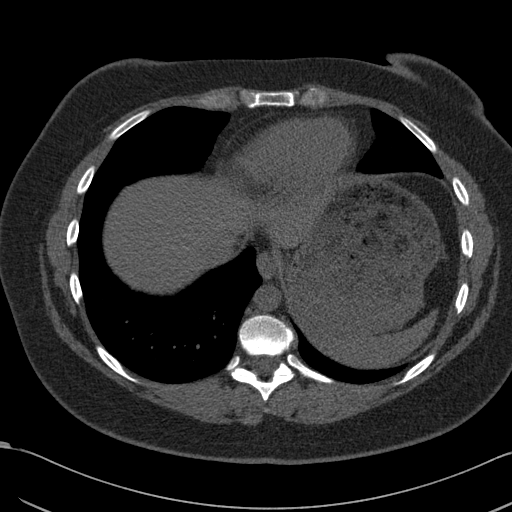
[im 29/106  vessel]
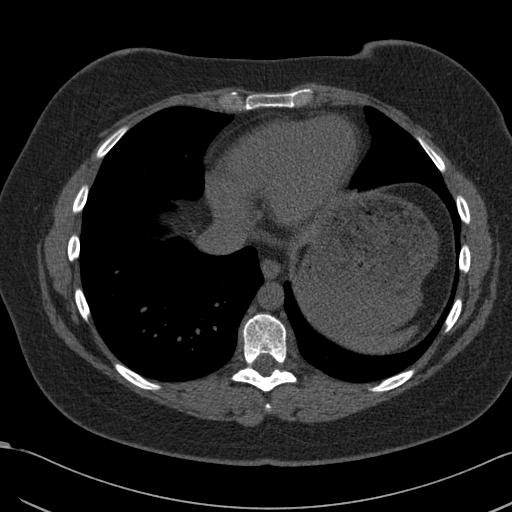
[im 39/106  vessel]
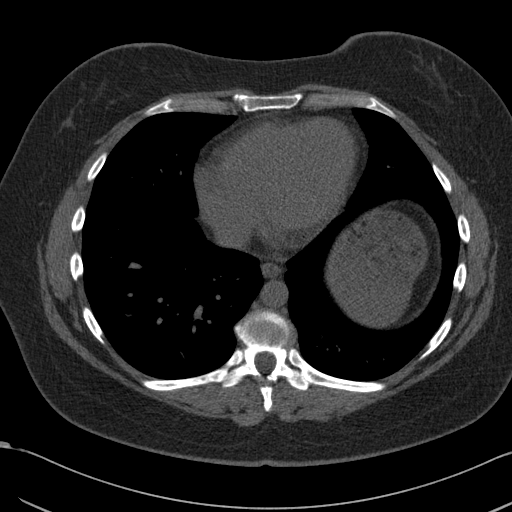
[im 48/106  vessel]
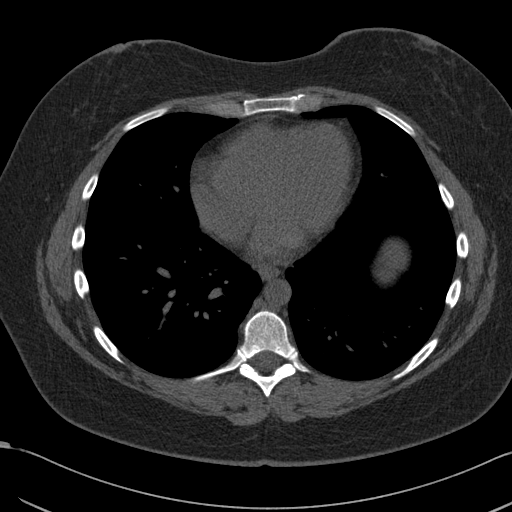
[im 48/106  lung]
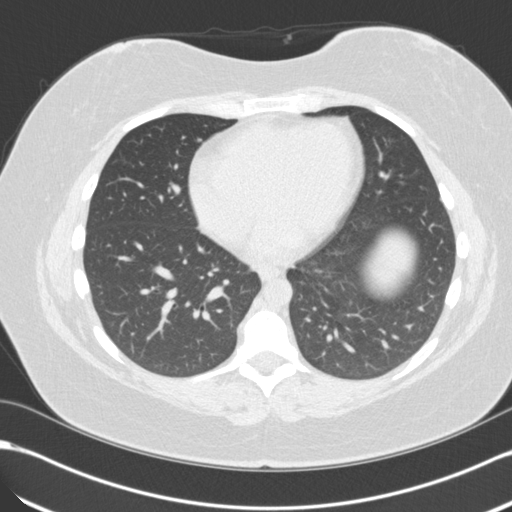
[im 58/106  vessel]
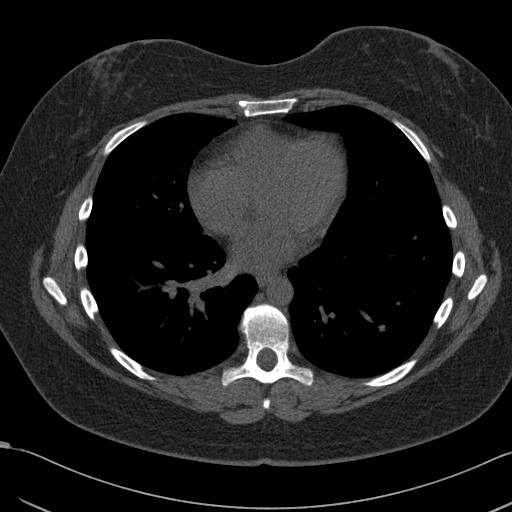
[im 67/106  vessel]
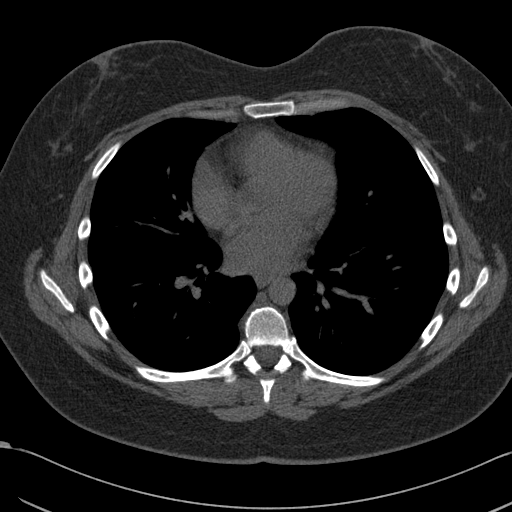
[im 77/106  vessel]
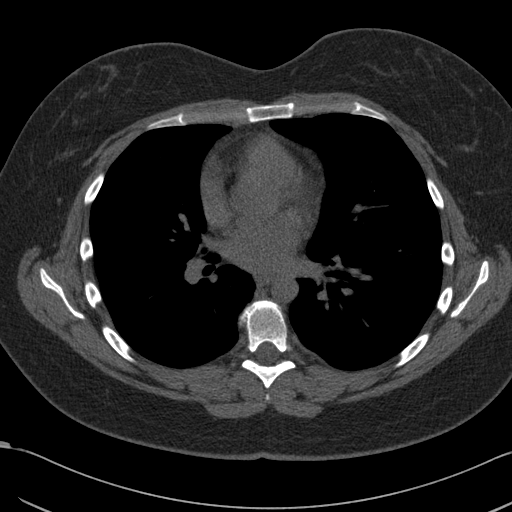
[im 86/106  vessel]
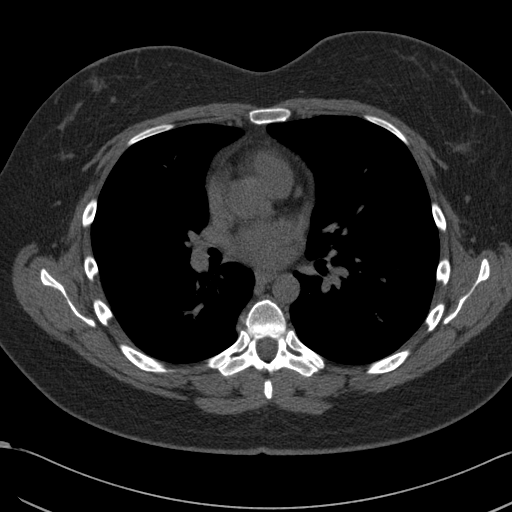
[im 86/106  lung]
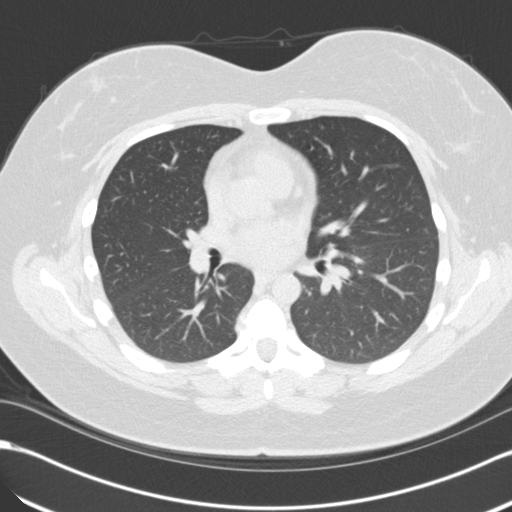
[im 96/106  vessel]
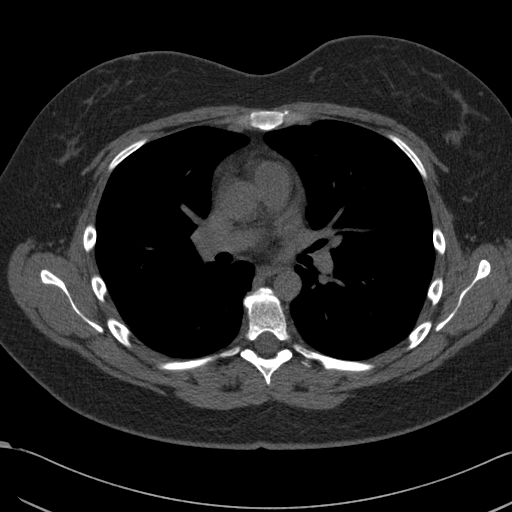

[10 of 20 positions shown; findings below may reference images not displayed]

FINDINGS: Non-cardiac: See separate report from [REDACTED].

Ascending Aorta:  Normal size aortic root and ascending aorta.

Pericardium: Normal

Coronary arteries:  Originating in normal position.
IMPRESSION: Coronary calcium score of 0. This was 0 percentile for age and sex
matched control.

Moranodi Lukase

EXAM:
OVER-READ INTERPRETATION  CT CHEST

The following report is an over-read performed by radiologist Dr.
over-read does not include interpretation of cardiac or coronary
anatomy or pathology. The coronary calcium score interpretation by
the cardiologist is attached.
FINDINGS: 2 mm right middle lobe nodule (image 13 of series 4) is highly
nonspecific and statistically likely benign in this young patient.
Within the visualized portions of the thorax there are no other
larger more suspicious appearing pulmonary nodules or masses, there
is no lymphadenopathy, no consolidative airspace disease, no pleural
effusion and no pneumothorax. Visualized portions of the upper
abdomen are unremarkable. There are no aggressive appearing lytic or
blastic lesions noted in the visualized portions of the skeleton.
IMPRESSION: 1. No significant incidental noncardiac findings.
2. Tiny 2 mm pulmonary nodule in the right middle lobe incidentally
noted. This is almost certainly benign in this young patient. If the
patient is at high risk for bronchogenic carcinoma, follow-up chest
CT at 1 year is recommended. If the patient is at low risk, no
follow-up is needed. This recommendation follows the consensus
statement: Guidelines for Management of Small Pulmonary Nodules
Detected on CT Scans: A Statement from the [HOSPITAL] as
